# Patient Record
Sex: Female | Born: 1975 | Race: Black or African American | Hispanic: No | Smoking: Never smoker
Health system: Southern US, Community
[De-identification: ages and names within clinical notes are randomized; demographics above are authoritative.]

## PROBLEM LIST (undated history)

## (undated) DIAGNOSIS — M549 Dorsalgia, unspecified: Secondary | ICD-10-CM

## (undated) DIAGNOSIS — F329 Major depressive disorder, single episode, unspecified: Secondary | ICD-10-CM

## (undated) DIAGNOSIS — I10 Essential (primary) hypertension: Secondary | ICD-10-CM

## (undated) DIAGNOSIS — E785 Hyperlipidemia, unspecified: Secondary | ICD-10-CM

## (undated) DIAGNOSIS — F32A Depression, unspecified: Secondary | ICD-10-CM

## (undated) DIAGNOSIS — K5900 Constipation, unspecified: Secondary | ICD-10-CM

## (undated) DIAGNOSIS — K829 Disease of gallbladder, unspecified: Secondary | ICD-10-CM

## (undated) DIAGNOSIS — E559 Vitamin D deficiency, unspecified: Secondary | ICD-10-CM

## (undated) DIAGNOSIS — K219 Gastro-esophageal reflux disease without esophagitis: Secondary | ICD-10-CM

## (undated) DIAGNOSIS — R609 Edema, unspecified: Secondary | ICD-10-CM

## (undated) DIAGNOSIS — M797 Fibromyalgia: Secondary | ICD-10-CM

## (undated) DIAGNOSIS — M2550 Pain in unspecified joint: Secondary | ICD-10-CM

## (undated) DIAGNOSIS — G35 Multiple sclerosis: Secondary | ICD-10-CM

## (undated) DIAGNOSIS — M8580 Other specified disorders of bone density and structure, unspecified site: Secondary | ICD-10-CM

## (undated) HISTORY — DX: Vitamin D deficiency, unspecified: E55.9

## (undated) HISTORY — DX: Edema, unspecified: R60.9

## (undated) HISTORY — PX: ABDOMINAL HYSTERECTOMY: SHX81

## (undated) HISTORY — DX: Constipation, unspecified: K59.00

## (undated) HISTORY — DX: Depression, unspecified: F32.A

## (undated) HISTORY — DX: Essential (primary) hypertension: I10

## (undated) HISTORY — DX: Hyperlipidemia, unspecified: E78.5

## (undated) HISTORY — PX: BREAST REDUCTION SURGERY: SHX8

## (undated) HISTORY — DX: Fibromyalgia: M79.7

## (undated) HISTORY — DX: Pain in unspecified joint: M25.50

## (undated) HISTORY — DX: Gastro-esophageal reflux disease without esophagitis: K21.9

## (undated) HISTORY — PX: TMJ ARTHROPLASTY: SHX1066

## (undated) HISTORY — DX: Multiple sclerosis: G35

## (undated) HISTORY — PX: CHOLECYSTECTOMY: SHX55

## (undated) HISTORY — DX: Other specified disorders of bone density and structure, unspecified site: M85.80

## (undated) HISTORY — DX: Dorsalgia, unspecified: M54.9

## (undated) HISTORY — DX: Disease of gallbladder, unspecified: K82.9

## (undated) HISTORY — PX: ABDOMINOPLASTY: SUR9

## (undated) HISTORY — DX: Major depressive disorder, single episode, unspecified: F32.9

---

## 2016-06-07 DIAGNOSIS — M5126 Other intervertebral disc displacement, lumbar region: Secondary | ICD-10-CM

## 2016-06-07 HISTORY — DX: Other intervertebral disc displacement, lumbar region: M51.26

## 2016-07-10 DIAGNOSIS — M94262 Chondromalacia, left knee: Secondary | ICD-10-CM

## 2016-07-10 HISTORY — DX: Chondromalacia, left knee: M94.262

## 2016-08-01 DIAGNOSIS — M542 Cervicalgia: Secondary | ICD-10-CM | POA: Diagnosis not present

## 2016-08-04 DIAGNOSIS — M25562 Pain in left knee: Secondary | ICD-10-CM | POA: Diagnosis not present

## 2016-08-09 DIAGNOSIS — M25562 Pain in left knee: Secondary | ICD-10-CM | POA: Diagnosis not present

## 2016-08-10 DIAGNOSIS — M542 Cervicalgia: Secondary | ICD-10-CM | POA: Diagnosis not present

## 2016-08-15 DIAGNOSIS — M25562 Pain in left knee: Secondary | ICD-10-CM | POA: Diagnosis not present

## 2016-08-17 DIAGNOSIS — M25562 Pain in left knee: Secondary | ICD-10-CM | POA: Diagnosis not present

## 2016-08-21 DIAGNOSIS — H9311 Tinnitus, right ear: Secondary | ICD-10-CM | POA: Diagnosis not present

## 2016-08-21 DIAGNOSIS — M94262 Chondromalacia, left knee: Secondary | ICD-10-CM | POA: Diagnosis not present

## 2016-08-21 DIAGNOSIS — H6983 Other specified disorders of Eustachian tube, bilateral: Secondary | ICD-10-CM | POA: Diagnosis not present

## 2016-08-24 DIAGNOSIS — M25562 Pain in left knee: Secondary | ICD-10-CM | POA: Diagnosis not present

## 2016-08-25 DIAGNOSIS — H9311 Tinnitus, right ear: Secondary | ICD-10-CM | POA: Diagnosis not present

## 2016-08-25 DIAGNOSIS — R9082 White matter disease, unspecified: Secondary | ICD-10-CM | POA: Diagnosis not present

## 2016-08-29 DIAGNOSIS — G35 Multiple sclerosis: Secondary | ICD-10-CM | POA: Diagnosis not present

## 2016-08-30 DIAGNOSIS — M25562 Pain in left knee: Secondary | ICD-10-CM | POA: Diagnosis not present

## 2016-08-31 DIAGNOSIS — H9311 Tinnitus, right ear: Secondary | ICD-10-CM | POA: Diagnosis not present

## 2016-09-06 DIAGNOSIS — M25562 Pain in left knee: Secondary | ICD-10-CM | POA: Diagnosis not present

## 2016-09-06 DIAGNOSIS — M542 Cervicalgia: Secondary | ICD-10-CM | POA: Diagnosis not present

## 2016-09-11 DIAGNOSIS — R300 Dysuria: Secondary | ICD-10-CM | POA: Diagnosis not present

## 2016-09-11 DIAGNOSIS — Z6833 Body mass index (BMI) 33.0-33.9, adult: Secondary | ICD-10-CM | POA: Diagnosis not present

## 2016-09-11 DIAGNOSIS — N390 Urinary tract infection, site not specified: Secondary | ICD-10-CM | POA: Diagnosis not present

## 2016-09-20 DIAGNOSIS — M25562 Pain in left knee: Secondary | ICD-10-CM | POA: Diagnosis not present

## 2016-11-20 DIAGNOSIS — H9203 Otalgia, bilateral: Secondary | ICD-10-CM | POA: Diagnosis not present

## 2016-11-20 DIAGNOSIS — H93A1 Pulsatile tinnitus, right ear: Secondary | ICD-10-CM | POA: Diagnosis not present

## 2016-11-21 DIAGNOSIS — Z5181 Encounter for therapeutic drug level monitoring: Secondary | ICD-10-CM | POA: Diagnosis not present

## 2016-11-21 DIAGNOSIS — Z79899 Other long term (current) drug therapy: Secondary | ICD-10-CM | POA: Diagnosis not present

## 2016-11-21 DIAGNOSIS — F988 Other specified behavioral and emotional disorders with onset usually occurring in childhood and adolescence: Secondary | ICD-10-CM | POA: Diagnosis not present

## 2016-11-27 DIAGNOSIS — M546 Pain in thoracic spine: Secondary | ICD-10-CM | POA: Diagnosis not present

## 2016-11-27 DIAGNOSIS — Z79899 Other long term (current) drug therapy: Secondary | ICD-10-CM | POA: Diagnosis not present

## 2016-11-27 DIAGNOSIS — Z130 Encounter for screening for diseases of the blood and blood-forming organs and certain disorders involving the immune mechanism: Secondary | ICD-10-CM | POA: Diagnosis not present

## 2016-11-27 DIAGNOSIS — M542 Cervicalgia: Secondary | ICD-10-CM | POA: Diagnosis not present

## 2016-11-27 DIAGNOSIS — R748 Abnormal levels of other serum enzymes: Secondary | ICD-10-CM | POA: Diagnosis not present

## 2016-11-27 DIAGNOSIS — M47817 Spondylosis without myelopathy or radiculopathy, lumbosacral region: Secondary | ICD-10-CM | POA: Diagnosis not present

## 2016-11-27 DIAGNOSIS — R5383 Other fatigue: Secondary | ICD-10-CM | POA: Diagnosis not present

## 2016-11-27 DIAGNOSIS — D509 Iron deficiency anemia, unspecified: Secondary | ICD-10-CM | POA: Diagnosis not present

## 2016-11-27 DIAGNOSIS — E538 Deficiency of other specified B group vitamins: Secondary | ICD-10-CM | POA: Diagnosis not present

## 2016-11-28 DIAGNOSIS — H93A1 Pulsatile tinnitus, right ear: Secondary | ICD-10-CM | POA: Diagnosis not present

## 2016-12-19 DIAGNOSIS — Q165 Congenital malformation of inner ear: Secondary | ICD-10-CM | POA: Diagnosis not present

## 2016-12-19 DIAGNOSIS — H9311 Tinnitus, right ear: Secondary | ICD-10-CM | POA: Diagnosis not present

## 2017-01-03 DIAGNOSIS — G35 Multiple sclerosis: Secondary | ICD-10-CM | POA: Diagnosis not present

## 2017-01-04 DIAGNOSIS — N390 Urinary tract infection, site not specified: Secondary | ICD-10-CM | POA: Diagnosis not present

## 2017-01-19 DIAGNOSIS — M8580 Other specified disorders of bone density and structure, unspecified site: Secondary | ICD-10-CM | POA: Diagnosis not present

## 2017-01-19 DIAGNOSIS — Z8669 Personal history of other diseases of the nervous system and sense organs: Secondary | ICD-10-CM | POA: Diagnosis not present

## 2017-02-03 DIAGNOSIS — Z6830 Body mass index (BMI) 30.0-30.9, adult: Secondary | ICD-10-CM | POA: Diagnosis not present

## 2017-02-03 DIAGNOSIS — Z8619 Personal history of other infectious and parasitic diseases: Secondary | ICD-10-CM | POA: Diagnosis not present

## 2017-02-12 DIAGNOSIS — N898 Other specified noninflammatory disorders of vagina: Secondary | ICD-10-CM | POA: Diagnosis not present

## 2017-02-12 DIAGNOSIS — Z113 Encounter for screening for infections with a predominantly sexual mode of transmission: Secondary | ICD-10-CM | POA: Diagnosis not present

## 2017-02-12 DIAGNOSIS — N760 Acute vaginitis: Secondary | ICD-10-CM | POA: Diagnosis not present

## 2017-03-05 DIAGNOSIS — H9311 Tinnitus, right ear: Secondary | ICD-10-CM | POA: Diagnosis not present

## 2017-03-05 DIAGNOSIS — H9202 Otalgia, left ear: Secondary | ICD-10-CM | POA: Diagnosis not present

## 2017-03-05 DIAGNOSIS — H9190 Unspecified hearing loss, unspecified ear: Secondary | ICD-10-CM | POA: Diagnosis not present

## 2017-04-09 DIAGNOSIS — R102 Pelvic and perineal pain: Secondary | ICD-10-CM | POA: Diagnosis not present

## 2017-04-09 DIAGNOSIS — N390 Urinary tract infection, site not specified: Secondary | ICD-10-CM | POA: Diagnosis not present

## 2017-04-09 DIAGNOSIS — N83209 Unspecified ovarian cyst, unspecified side: Secondary | ICD-10-CM | POA: Diagnosis not present

## 2017-04-09 DIAGNOSIS — R300 Dysuria: Secondary | ICD-10-CM | POA: Diagnosis not present

## 2017-04-17 DIAGNOSIS — R102 Pelvic and perineal pain: Secondary | ICD-10-CM | POA: Diagnosis not present

## 2017-04-26 DIAGNOSIS — N9419 Other specified dyspareunia: Secondary | ICD-10-CM | POA: Diagnosis not present

## 2017-04-26 DIAGNOSIS — N760 Acute vaginitis: Secondary | ICD-10-CM | POA: Diagnosis not present

## 2017-04-26 DIAGNOSIS — R102 Pelvic and perineal pain: Secondary | ICD-10-CM | POA: Diagnosis not present

## 2017-07-27 DIAGNOSIS — Z136 Encounter for screening for cardiovascular disorders: Secondary | ICD-10-CM | POA: Diagnosis not present

## 2017-07-27 DIAGNOSIS — Z13220 Encounter for screening for lipoid disorders: Secondary | ICD-10-CM | POA: Diagnosis not present

## 2017-07-27 DIAGNOSIS — Z0000 Encounter for general adult medical examination without abnormal findings: Secondary | ICD-10-CM | POA: Diagnosis not present

## 2017-07-27 DIAGNOSIS — Z130 Encounter for screening for diseases of the blood and blood-forming organs and certain disorders involving the immune mechanism: Secondary | ICD-10-CM | POA: Diagnosis not present

## 2017-07-27 DIAGNOSIS — Z79899 Other long term (current) drug therapy: Secondary | ICD-10-CM | POA: Diagnosis not present

## 2017-07-27 DIAGNOSIS — N959 Unspecified menopausal and perimenopausal disorder: Secondary | ICD-10-CM | POA: Diagnosis not present

## 2017-07-27 DIAGNOSIS — Z1321 Encounter for screening for nutritional disorder: Secondary | ICD-10-CM | POA: Diagnosis not present

## 2017-08-02 DIAGNOSIS — Z5181 Encounter for therapeutic drug level monitoring: Secondary | ICD-10-CM | POA: Diagnosis not present

## 2017-08-02 DIAGNOSIS — F988 Other specified behavioral and emotional disorders with onset usually occurring in childhood and adolescence: Secondary | ICD-10-CM | POA: Diagnosis not present

## 2017-08-02 DIAGNOSIS — G4709 Other insomnia: Secondary | ICD-10-CM | POA: Diagnosis not present

## 2017-08-20 DIAGNOSIS — M2550 Pain in unspecified joint: Secondary | ICD-10-CM | POA: Diagnosis not present

## 2017-08-20 DIAGNOSIS — Z90710 Acquired absence of both cervix and uterus: Secondary | ICD-10-CM | POA: Diagnosis not present

## 2017-08-20 DIAGNOSIS — F329 Major depressive disorder, single episode, unspecified: Secondary | ICD-10-CM | POA: Diagnosis not present

## 2017-08-20 DIAGNOSIS — J029 Acute pharyngitis, unspecified: Secondary | ICD-10-CM | POA: Diagnosis not present

## 2017-08-20 DIAGNOSIS — Z9049 Acquired absence of other specified parts of digestive tract: Secondary | ICD-10-CM | POA: Diagnosis not present

## 2017-08-20 DIAGNOSIS — K219 Gastro-esophageal reflux disease without esophagitis: Secondary | ICD-10-CM | POA: Diagnosis not present

## 2017-08-20 DIAGNOSIS — M810 Age-related osteoporosis without current pathological fracture: Secondary | ICD-10-CM | POA: Diagnosis not present

## 2017-08-20 DIAGNOSIS — G35 Multiple sclerosis: Secondary | ICD-10-CM | POA: Diagnosis not present

## 2017-08-20 DIAGNOSIS — J069 Acute upper respiratory infection, unspecified: Secondary | ICD-10-CM | POA: Diagnosis not present

## 2017-08-24 DIAGNOSIS — R0602 Shortness of breath: Secondary | ICD-10-CM | POA: Insufficient documentation

## 2017-08-24 DIAGNOSIS — J069 Acute upper respiratory infection, unspecified: Secondary | ICD-10-CM | POA: Insufficient documentation

## 2017-08-24 HISTORY — DX: Acute upper respiratory infection, unspecified: J06.9

## 2017-08-24 HISTORY — DX: Shortness of breath: R06.02

## 2017-09-04 DIAGNOSIS — Z1231 Encounter for screening mammogram for malignant neoplasm of breast: Secondary | ICD-10-CM | POA: Diagnosis not present

## 2017-09-10 DIAGNOSIS — M545 Low back pain: Secondary | ICD-10-CM | POA: Diagnosis not present

## 2017-09-10 DIAGNOSIS — R5383 Other fatigue: Secondary | ICD-10-CM | POA: Diagnosis not present

## 2017-09-10 DIAGNOSIS — M2550 Pain in unspecified joint: Secondary | ICD-10-CM | POA: Diagnosis not present

## 2017-09-10 DIAGNOSIS — G35 Multiple sclerosis: Secondary | ICD-10-CM | POA: Diagnosis not present

## 2017-09-10 DIAGNOSIS — E559 Vitamin D deficiency, unspecified: Secondary | ICD-10-CM | POA: Diagnosis not present

## 2017-10-01 DIAGNOSIS — G35 Multiple sclerosis: Secondary | ICD-10-CM | POA: Diagnosis not present

## 2017-10-01 DIAGNOSIS — R102 Pelvic and perineal pain: Secondary | ICD-10-CM | POA: Diagnosis not present

## 2017-10-01 DIAGNOSIS — K219 Gastro-esophageal reflux disease without esophagitis: Secondary | ICD-10-CM | POA: Diagnosis not present

## 2017-10-01 DIAGNOSIS — N760 Acute vaginitis: Secondary | ICD-10-CM | POA: Insufficient documentation

## 2017-10-01 DIAGNOSIS — Z114 Encounter for screening for human immunodeficiency virus [HIV]: Secondary | ICD-10-CM | POA: Diagnosis not present

## 2017-10-01 DIAGNOSIS — G8929 Other chronic pain: Secondary | ICD-10-CM | POA: Diagnosis not present

## 2017-10-01 DIAGNOSIS — Z885 Allergy status to narcotic agent status: Secondary | ICD-10-CM | POA: Diagnosis not present

## 2017-10-01 DIAGNOSIS — Z9049 Acquired absence of other specified parts of digestive tract: Secondary | ICD-10-CM | POA: Diagnosis not present

## 2017-10-01 DIAGNOSIS — M810 Age-related osteoporosis without current pathological fracture: Secondary | ICD-10-CM | POA: Diagnosis not present

## 2017-10-01 DIAGNOSIS — Z113 Encounter for screening for infections with a predominantly sexual mode of transmission: Secondary | ICD-10-CM | POA: Insufficient documentation

## 2017-10-01 HISTORY — DX: Encounter for screening for infections with a predominantly sexual mode of transmission: Z11.3

## 2017-10-01 HISTORY — DX: Acute vaginitis: N76.0

## 2017-10-09 DIAGNOSIS — M7061 Trochanteric bursitis, right hip: Secondary | ICD-10-CM | POA: Diagnosis not present

## 2017-10-09 DIAGNOSIS — M2550 Pain in unspecified joint: Secondary | ICD-10-CM | POA: Diagnosis not present

## 2017-10-09 DIAGNOSIS — M8588 Other specified disorders of bone density and structure, other site: Secondary | ICD-10-CM | POA: Diagnosis not present

## 2017-10-09 DIAGNOSIS — G35 Multiple sclerosis: Secondary | ICD-10-CM | POA: Diagnosis not present

## 2017-10-19 DIAGNOSIS — N3000 Acute cystitis without hematuria: Secondary | ICD-10-CM | POA: Diagnosis not present

## 2017-10-19 DIAGNOSIS — R350 Frequency of micturition: Secondary | ICD-10-CM | POA: Diagnosis not present

## 2017-10-19 DIAGNOSIS — R300 Dysuria: Secondary | ICD-10-CM | POA: Diagnosis not present

## 2017-10-24 DIAGNOSIS — M2550 Pain in unspecified joint: Secondary | ICD-10-CM | POA: Diagnosis not present

## 2017-10-24 DIAGNOSIS — M8588 Other specified disorders of bone density and structure, other site: Secondary | ICD-10-CM | POA: Diagnosis not present

## 2017-10-24 DIAGNOSIS — E559 Vitamin D deficiency, unspecified: Secondary | ICD-10-CM | POA: Diagnosis not present

## 2017-10-24 DIAGNOSIS — G35 Multiple sclerosis: Secondary | ICD-10-CM | POA: Diagnosis not present

## 2017-11-19 DIAGNOSIS — N9419 Other specified dyspareunia: Secondary | ICD-10-CM | POA: Diagnosis not present

## 2017-11-19 DIAGNOSIS — R102 Pelvic and perineal pain: Secondary | ICD-10-CM | POA: Diagnosis not present

## 2017-12-13 DIAGNOSIS — R300 Dysuria: Secondary | ICD-10-CM | POA: Diagnosis not present

## 2017-12-13 DIAGNOSIS — M797 Fibromyalgia: Secondary | ICD-10-CM | POA: Diagnosis not present

## 2017-12-13 DIAGNOSIS — G35 Multiple sclerosis: Secondary | ICD-10-CM | POA: Diagnosis not present

## 2018-01-29 DIAGNOSIS — Z1159 Encounter for screening for other viral diseases: Secondary | ICD-10-CM | POA: Diagnosis not present

## 2018-01-29 DIAGNOSIS — R350 Frequency of micturition: Secondary | ICD-10-CM | POA: Diagnosis not present

## 2018-01-29 DIAGNOSIS — R1011 Right upper quadrant pain: Secondary | ICD-10-CM | POA: Diagnosis not present

## 2018-01-29 DIAGNOSIS — R0602 Shortness of breath: Secondary | ICD-10-CM | POA: Diagnosis not present

## 2018-01-29 DIAGNOSIS — K219 Gastro-esophageal reflux disease without esophagitis: Secondary | ICD-10-CM | POA: Diagnosis not present

## 2018-01-29 DIAGNOSIS — G35 Multiple sclerosis: Secondary | ICD-10-CM | POA: Diagnosis not present

## 2018-01-29 DIAGNOSIS — Z113 Encounter for screening for infections with a predominantly sexual mode of transmission: Secondary | ICD-10-CM | POA: Diagnosis not present

## 2018-01-29 DIAGNOSIS — Z885 Allergy status to narcotic agent status: Secondary | ICD-10-CM | POA: Diagnosis not present

## 2018-01-29 DIAGNOSIS — M810 Age-related osteoporosis without current pathological fracture: Secondary | ICD-10-CM | POA: Diagnosis not present

## 2018-01-29 DIAGNOSIS — G8929 Other chronic pain: Secondary | ICD-10-CM | POA: Diagnosis not present

## 2018-01-29 DIAGNOSIS — M549 Dorsalgia, unspecified: Secondary | ICD-10-CM | POA: Diagnosis not present

## 2018-01-29 DIAGNOSIS — N898 Other specified noninflammatory disorders of vagina: Secondary | ICD-10-CM | POA: Diagnosis not present

## 2018-01-29 DIAGNOSIS — Z7289 Other problems related to lifestyle: Secondary | ICD-10-CM | POA: Diagnosis not present

## 2018-01-29 DIAGNOSIS — Z79899 Other long term (current) drug therapy: Secondary | ICD-10-CM | POA: Diagnosis not present

## 2018-02-03 DIAGNOSIS — R350 Frequency of micturition: Secondary | ICD-10-CM

## 2018-02-03 DIAGNOSIS — N898 Other specified noninflammatory disorders of vagina: Secondary | ICD-10-CM | POA: Insufficient documentation

## 2018-02-03 DIAGNOSIS — R1011 Right upper quadrant pain: Secondary | ICD-10-CM

## 2018-02-03 DIAGNOSIS — R609 Edema, unspecified: Secondary | ICD-10-CM | POA: Insufficient documentation

## 2018-02-03 DIAGNOSIS — Z7289 Other problems related to lifestyle: Secondary | ICD-10-CM

## 2018-02-03 HISTORY — DX: Other problems related to lifestyle: Z72.89

## 2018-02-03 HISTORY — DX: Other specified noninflammatory disorders of vagina: N89.8

## 2018-02-03 HISTORY — DX: Frequency of micturition: R35.0

## 2018-02-03 HISTORY — DX: Right upper quadrant pain: R10.11

## 2018-02-05 DIAGNOSIS — Z9049 Acquired absence of other specified parts of digestive tract: Secondary | ICD-10-CM | POA: Diagnosis not present

## 2018-02-05 DIAGNOSIS — R1011 Right upper quadrant pain: Secondary | ICD-10-CM | POA: Diagnosis not present

## 2018-02-06 DIAGNOSIS — M2550 Pain in unspecified joint: Secondary | ICD-10-CM | POA: Diagnosis not present

## 2018-02-06 DIAGNOSIS — R51 Headache: Secondary | ICD-10-CM | POA: Diagnosis not present

## 2018-02-06 DIAGNOSIS — R5383 Other fatigue: Secondary | ICD-10-CM | POA: Diagnosis not present

## 2018-02-06 DIAGNOSIS — G35 Multiple sclerosis: Secondary | ICD-10-CM | POA: Diagnosis not present

## 2018-02-06 DIAGNOSIS — M797 Fibromyalgia: Secondary | ICD-10-CM | POA: Diagnosis not present

## 2018-03-21 DIAGNOSIS — G8929 Other chronic pain: Secondary | ICD-10-CM | POA: Diagnosis not present

## 2018-03-21 DIAGNOSIS — R1011 Right upper quadrant pain: Secondary | ICD-10-CM | POA: Diagnosis not present

## 2018-03-21 DIAGNOSIS — G35 Multiple sclerosis: Secondary | ICD-10-CM | POA: Diagnosis not present

## 2018-03-25 DIAGNOSIS — H20012 Primary iridocyclitis, left eye: Secondary | ICD-10-CM | POA: Diagnosis not present

## 2018-03-28 DIAGNOSIS — H20012 Primary iridocyclitis, left eye: Secondary | ICD-10-CM | POA: Diagnosis not present

## 2018-04-02 ENCOUNTER — Telehealth: Payer: Self-pay | Admitting: Neurology

## 2018-04-02 ENCOUNTER — Other Ambulatory Visit: Payer: Self-pay

## 2018-04-02 ENCOUNTER — Encounter: Payer: Self-pay | Admitting: Neurology

## 2018-04-02 ENCOUNTER — Ambulatory Visit (INDEPENDENT_AMBULATORY_CARE_PROVIDER_SITE_OTHER): Payer: Medicare Other | Admitting: Neurology

## 2018-04-02 VITALS — BP 126/92 | HR 80 | Resp 18 | Ht 66.0 in | Wt 198.0 lb

## 2018-04-02 DIAGNOSIS — R200 Anesthesia of skin: Secondary | ICD-10-CM

## 2018-04-02 DIAGNOSIS — R7989 Other specified abnormal findings of blood chemistry: Secondary | ICD-10-CM

## 2018-04-02 DIAGNOSIS — R9082 White matter disease, unspecified: Secondary | ICD-10-CM | POA: Diagnosis not present

## 2018-04-02 DIAGNOSIS — G4700 Insomnia, unspecified: Secondary | ICD-10-CM | POA: Insufficient documentation

## 2018-04-02 DIAGNOSIS — R109 Unspecified abdominal pain: Secondary | ICD-10-CM | POA: Diagnosis not present

## 2018-04-02 HISTORY — DX: Insomnia, unspecified: G47.00

## 2018-04-02 HISTORY — DX: Other specified abnormal findings of blood chemistry: R79.89

## 2018-04-02 HISTORY — DX: Anesthesia of skin: R20.0

## 2018-04-02 HISTORY — DX: White matter disease, unspecified: R90.82

## 2018-04-02 NOTE — Telephone Encounter (Signed)
Medicare/medicaid order sent to [facility]. No auth they will reach out to the pt to schedule.  °

## 2018-04-02 NOTE — Progress Notes (Signed)
[facility]  PATIENT: Erica Henson DOB: 1975-10-25  REFERRING DOCTOR OR PCP:  Maud Deed, PA-C/ Dr. Delbert Harness SOURCE: Patient, notes from neurologist in [place] in [address], notes from primary care, multiple MRI and laboratory reports.  _________________________________   HISTORICAL  CHIEF COMPLAINT:  Chief Complaint  Patient presents with  . Multiple Sclerosis    Armando is here to transfer care of MS to Dr. Epimenio Foot.  Sts. she was dx. with MS in 2008.  Sts. presenting sx. was intermittent numbness random parts of her body. Sts. she had an MRI and LP but can't remember the results. Sts. she has seen neurologists in [place], [place] (Dr. Dutch Quint), and [place], [place] (Dr. Lelon Perla).  Sts. she has been on Betaseron, Avonex, Copaxone, but wsa told she had to stop injections b/c she had antibodies to them that would cause kidney damange.  Sts. she also tried Gilenya but   . Thoracic Pain    stopped b/c she didn't feel it was helping, and Tecfidera but stopped due to stomach upset.  Has not been on a dmt in the last 3 yrs.  Sts. she chooses to treat the sx. of MS with natural remedies.   Denies family hx. of MS. Sts. last MRI was in [place] at [facility] in 2018. Today would like to discuss right sided intermittent thoracic pain which she believes to be an MS hug./fim    HISTORY OF PRESENT ILLNESS:  I had the pleasure seeing a patient, Erica Henson, at the MS center in [facility] for neurologic consultation regarding her diagnosis of MS and dysesthesias.  She is a 42 y.o. woman who was diagnosed with MS in 2008.   She was having headaches and various pains and numbness.     She also noted issues with cognition.   She had an MRI of the brain that was reportedly consistent with MS.  Initial MRI reports from 2008 of the cervical spine states possible C5C6 focus to the right only seen on one slice of one sequence.   She had a lumbar  puncture 9/16/21008.   CSF did not show OCB or elevated IgG index.   She reports being told there was a lesion in her brainstem (I don't see on any report)  She was diagnosed with MS by Dr. Noelle Penner in [place], [place] and placed on Betaseron and then switched to Avonex.   She had antibodies against interferons.   She was started on Copaxone but did not feel it helped.    She also tried on Gilenya and Tecfidera but either felt there was no benefit (Gilenya) or GI side effects.    The last 3 years, she has tried natural remedies and vitamins.   She reports multiple rounds of steroids (30 times) over the first 10 years.     A report from 08/22/2017 states "Patches of T2 hyperintensity are identified with a single new lesion in the right parieto-occipal WM.   These are non-specifc and may be related to demyelination form MS, other demyelinating syndrome, infectious or other less likely etiology"  Repeat cervical spine imaging 08/23/2007 showed no lesions in the cervical spine or upper thoracic spinal cord.     An MRI report from 08/06/2011 [facility]) states that there are multiple white matter foci unchanged since the previous study (08/08/2010).  "There are greater than 9 lesions, at least 3 of which are in the periventricular white matter oriented perpendicular to the lateral ventricles.  There are  no subcortical lesions.  There are no infratentorial lesions.  No enhancing lesions are seen".   The last MRI of the cervical spine performed at [facility] 01/01/2010 states (normal MRI of the cervical spine).  The last MRI of the thoracic spine dated 08/11/2008 at [facility] has an impression "no evidence of demyelinating disease".  There is no significant degenerative change noted.  She thinks last MRI was 2017 or 2018 but is not sure which body part [facility] [place]).      She reports difficulty with her gait and balance and sometimes trips easily.    She feels strength is ok but she fatigues easily and can't run.   She reports  numbness that comes and goes in her toes and sometimes notes her toes change colors.   She gets numbness in her arms.   Three years ago, she noted tongue numbness.     She reports a painful right dysesthetic pain in her flank with a burning quality.  Initially it was constant x several days and then became intermittent.       She denies urinary frequency and urge incontinence in the past and had a sling procedure in 2004.   She has 4 children.    Since the operation, her bladder has done well.  She notes floaters in her vision and has intermittent blurry vision.   She injured her left eye (charger plyug hit cornea) last week but is better now.  She notes a lot of fatigue.  She has a long history of insomnia.   She takes Ambien prn.      She notes cognitive fatigue and attentional issues and has prn Adderall (20 mg max).    She denies any current anxiety or depression but had this problem in the past.    She reports pain in her neck and back, worse with activity.   She reports degenerative disc changes in the neck and lumbar spine.   She was once diagnosed with osteopenia and placed on Vit D.      There is no FH of MS.     Her father has AD.          REVIEW OF SYSTEMS: Constitutional: No fevers, chills, sweats, or change in appetite.   She has occasional insomnia. Eyes: No visual changes, double vision, eye pain Ear, nose and throat: No hearing loss, ear pain, nasal congestion, sore throat Cardiovascular: No chest pain, palpitations Respiratory: No shortness of breath at rest or with exertion.   No wheezes GastrointestinaI: No nausea, vomiting, diarrhea, abdominal pain, fecal incontinence Genitourinary: No dysuria, urinary retention or frequency.  No nocturia. Musculoskeletal: No neck pain, back pain Integumentary: No rash, pruritus, skin lesions Neurological: as above Psychiatric: She denies current depression or anxiety but had issues with this in the past. Endocrine: No palpitations,  diaphoresis, change in appetite, change in weigh or increased thirst Hematologic/Lymphatic: No anemia, purpura, petechiae. Allergic/Immunologic: No itchy/runny eyes, nasal congestion, recent allergic reactions, rashes  ALLERGIES: Not on File  HOME MEDICATIONS: No current outpatient medications on file.  PAST MEDICAL HISTORY: History reviewed. No pertinent past medical history.  PAST SURGICAL HISTORY: History reviewed. No pertinent surgical history.  FAMILY HISTORY: History reviewed. No pertinent family history.  SOCIAL HISTORY:  Social History   Socioeconomic History  . Marital status: Divorced    Spouse name: Not on file  . Number of children: Not on file  . Years of education: Not on file  . Highest education level: Not  on file  Occupational History  . Not on file  Social Needs  . Financial resource strain: Not on file  . Food insecurity:    Worry: Not on file    Inability: Not on file  . Transportation needs:    Medical: Not on file    Non-medical: Not on file  Tobacco Use  . Smoking status: Never Smoker  . Smokeless tobacco: Never Used  Substance and Sexual Activity  . Alcohol use: Yes    Comment: social  . Drug use: Never  . Sexual activity: Not on file  Lifestyle  . Physical activity:    Days per week: Not on file    Minutes per session: Not on file  . Stress: Not on file  Relationships  . Social connections:    Talks on phone: Not on file    Gets together: Not on file    Attends religious service: Not on file    Active member of club or organization: Not on file    Attends meetings of clubs or organizations: Not on file    Relationship status: Not on file  . Intimate partner violence:    Fear of current or ex partner: Not on file    Emotionally abused: Not on file    Physically abused: Not on file    Forced sexual activity: Not on file  Other Topics Concern  . Not on file  Social History Narrative  . Not on file     PHYSICAL  EXAM  Vitals:   04/02/18 1336  BP: (!) 126/92  Pulse: 80  Resp: 18  Weight: 198 lb (89.8 kg)  Height: 5\' 6"  (1.676 m)    Body mass index is 31.96 kg/m.   General: The patient is well-developed and well-nourished and in no acute distress  Eyes:  Funduscopic exam shows normal optic discs and retinal vessels.  Neck: The neck is supple, no carotid bruits are noted.  The neck is nontender.  Cardiovascular: The heart has a regular rate and rhythm with a normal S1 and S2. There were no murmurs, gallops or rubs. Lungs are clear to auscultation.  Skin: Extremities are without significant edema.  Musculoskeletal:  Back is nontender  Neurologic Exam  Mental status: The patient is alert and oriented x 3 at the time of the examination. The patient has apparent normal recent and remote memory, with an apparently normal attention span and concentration ability.   Speech is normal.  Cranial nerves: Extraocular movements are full. Pupils are equal, round, and reactive to light and accomodation.  Visual fields are full.  Facial symmetry is present. There is good facial sensation to soft touch bilaterally.Facial strength is normal.  Trapezius and sternocleidomastoid strength is normal. No dysarthria is noted.  The tongue is midline, and the patient has symmetric elevation of the soft palate. No obvious hearing deficits are noted.  Motor:  Muscle bulk is normal.   Tone is normal. Strength is  5 / 5 in all 4 extremities.   Sensory: She reported mildly asymmetric sensation to touch in the arms and legs temporary vibration sensation was symmetric.  Coordination: Cerebellar testing reveals good finger-nose-finger and heel-to-shin bilaterally.  Gait and station: Station is normal.   Gait is normal. Tandem gait is normal. Romberg is negative.   Reflexes: Deep tendon reflexes are symmetric and normal bilaterally.   Plantar responses are flexor.    DIAGNOSTIC DATA (LABS, IMAGING, TESTING) - I  reviewed patient records, labs, notes, testing and  imaging myself where available.      ASSESSMENT AND PLAN  Numbness - Plan: MR BRAIN W WO CONTRAST, MR CERVICAL SPINE W WO CONTRAST  White matter abnormality on MRI of brain - Plan: MR BRAIN W WO CONTRAST, MR CERVICAL SPINE W WO CONTRAST  Insomnia, unspecified type  Low vitamin D level   In summary, Ms. Buckles is a 42 year old woman who was diagnosed with MS in 2008 after presenting with pains and intermittent numbness.   Initial MRI of the brain showed lesions were sent for MS and a possible focus in the spinal cord.  Second MRI noted that the lesions were nonspecific and 2 of the cervical spine MRIs did not show continued evidence of a cervical spine plaque.  CSF did not show oligoclonal bands or elevated IgG index.  I discussed with her that she could have a very mild MS though I cannot be completely certain of the diagnosis.  We need to check another MRI of the brain and cervical spine and I will compare those with her prior scans if I am able to obtain them.  If she does show significant progression worrisome for MS, then the diagnosis is more likely and I would recommend that she go on a disease modifying therapy.  However, if the MRI is stable while she has gone 3 years off of a disease modifying therapy, then MS is less likely.  She would prefer supplements over and approved disease modifying therapy.  I mentioned that alpha lipoic acid may be helpful for some of her symptoms.  She will return in 4 months for regular visit or sooner based on the findings of the MRIs.  She should call if she has new or worsening symptoms.  Thank you for asking me to see Ms. Tasia Catchings.  Please let me know if I can be of further assistance with her or other patients in the future.     Richard A. Epimenio Foot, MD, [facility] 04/02/2018, 1:39 PM Certified in Neurology, Clinical Neurophysiology, Sleep Medicine, Pain Medicine and Neuroimaging  [facility] [address] [place], [place] [address] [phone]

## 2018-04-15 ENCOUNTER — Ambulatory Visit
Admission: RE | Admit: 2018-04-15 | Discharge: 2018-04-15 | Disposition: A | Discharge status: Home / Self Care | Payer: Medicare Other | Source: Ambulatory Visit | Attending: Neurology | Admitting: Neurology

## 2018-04-15 DIAGNOSIS — R200 Anesthesia of skin: Secondary | ICD-10-CM

## 2018-04-15 DIAGNOSIS — R9082 White matter disease, unspecified: Secondary | ICD-10-CM

## 2018-04-15 MED ORDER — GADOBENATE DIMEGLUMINE 529 MG/ML IV SOLN
18.0000 mL | Freq: Once | INTRAVENOUS | Status: AC | PRN
  Administered 2018-04-15: 18 mL via INTRAVENOUS

## 2018-04-16 ENCOUNTER — Telehealth: Payer: Self-pay | Admitting: *Deleted

## 2018-04-16 NOTE — Telephone Encounter (Signed)
-----   Message from Asa Lente, MD sent at 04/15/2018  6:54 PM EDT ----- Please let her know that the MRI of the brain showed some old looking spots but nothing that looked new.  We never got her old films for comparison.    The MRI of the cervical spine did not show any abnormality in the spinal cord.

## 2018-04-16 NOTE — Telephone Encounter (Signed)
LMOM for Ceclia to call for results./fim

## 2018-04-16 NOTE — Telephone Encounter (Signed)
-----   Message from Richard A Sater, MD sent at 04/15/2018  6:54 PM EDT ----- Please let her know that the MRI of the brain showed some old looking spots but nothing that looked new.  We never got her old films for comparison.    The MRI of the cervical spine did not show any abnormality in the spinal cord. 

## 2018-04-16 NOTE — Telephone Encounter (Signed)
Spoke with Renee and reviewed below MRI reports. She verbalized understanding of same.  She dropped old MRI's off last wk when Dr. Epimenio Foot and I were both out of the office.  I was able to locate those and have given them to Dr. Epimenio Foot for review/fim

## 2018-04-19 ENCOUNTER — Telehealth: Payer: Self-pay | Admitting: Neurology

## 2018-04-19 NOTE — Telephone Encounter (Signed)
Please let her know I compared her MRIs from 2009 to the current MRIs, side by side.    The MRIs are unchanged... All the spots seen on current MRIs in the brain were seen on the 2009 MRI and the spine has no lesions.     The stability over so many years and the normal CSFmakes MS less likely so I recommend she continue off of MS medications

## 2018-05-02 DIAGNOSIS — N898 Other specified noninflammatory disorders of vagina: Secondary | ICD-10-CM | POA: Diagnosis not present

## 2018-05-02 DIAGNOSIS — R300 Dysuria: Secondary | ICD-10-CM | POA: Diagnosis not present

## 2018-05-10 DIAGNOSIS — R300 Dysuria: Secondary | ICD-10-CM | POA: Diagnosis not present

## 2018-05-20 ENCOUNTER — Telehealth: Payer: Self-pay | Admitting: Neurology

## 2018-05-20 NOTE — Telephone Encounter (Signed)
Spoke with Dr. Epimenio Foot- he tried reaching pt on 04/19/18 to discuss this.   "Please let her know I compared her MRIs from 2009 to the current MRIs, side by side.    The MRIs are unchanged... All the spots seen on current MRIs in the brain were seen on the 2009 MRI and the spine has no lesions.     The stability over so many years and the normal CSFmakes MS less likely so I recommend she continue off of MS medications"

## 2018-05-20 NOTE — Telephone Encounter (Signed)
Called pt, LVM for her to call.

## 2018-05-20 NOTE — Telephone Encounter (Signed)
Pt is asking for a call because she states she has not head from anyone re: the comparison of her MRI's.  The last note is from 04-19-2018 as to why it was not noted.

## 2018-05-21 NOTE — Telephone Encounter (Signed)
Patient called back. I relayed comparison results below per Dr. Epimenio Foot note. She verbalized understanding.   She wants to know what next steps would be to help manage the upper back pain, joint pain she is having. It is throughout her body. She has "nodule bumps" throughout her body that cause pain. Advised I will speak with Dr. Epimenio Foot and call her back.

## 2018-05-22 MED ORDER — MELOXICAM 7.5 MG PO TABS: 7.5000 mg | ORAL_TABLET | Freq: Every day | ORAL | 5 refills | Status: DC

## 2018-05-22 NOTE — Addendum Note (Signed)
Addended by: Hillis Range on: 05/22/2018 03:04 PM   Modules accepted: Orders

## 2018-05-22 NOTE — Telephone Encounter (Signed)
Late entry-Spoke with Dr. Epimenio Foot yesterday. He states she should f/u on skin nodules with PCP. For pain, can offer meloxicam 7.5 mg 1 tablet daily, #30, 5 refills if she would like to try this

## 2018-05-22 NOTE — Telephone Encounter (Signed)
Called pt. Advised she should f/u with PCP about skin nodules.  She was agreeable to try meloxicam 7.5mg  tab for pain.  Went over [employer] of medication with her. She would like rx sent to [facility] in [place], [place]. Advised her to call back if she has further questions/concerns.

## 2018-05-22 NOTE — Telephone Encounter (Signed)
Tried calling pt, line went silent, unable to leave message.

## 2018-05-23 DIAGNOSIS — G35 Multiple sclerosis: Secondary | ICD-10-CM | POA: Insufficient documentation

## 2018-05-23 DIAGNOSIS — G8929 Other chronic pain: Secondary | ICD-10-CM | POA: Diagnosis not present

## 2018-06-04 DIAGNOSIS — N760 Acute vaginitis: Secondary | ICD-10-CM | POA: Diagnosis not present

## 2018-06-04 DIAGNOSIS — B9689 Other specified bacterial agents as the cause of diseases classified elsewhere: Secondary | ICD-10-CM | POA: Diagnosis not present

## 2018-06-19 DIAGNOSIS — H524 Presbyopia: Secondary | ICD-10-CM | POA: Diagnosis not present

## 2018-06-19 DIAGNOSIS — H04123 Dry eye syndrome of bilateral lacrimal glands: Secondary | ICD-10-CM | POA: Diagnosis not present

## 2018-06-24 DIAGNOSIS — G8929 Other chronic pain: Secondary | ICD-10-CM | POA: Diagnosis not present

## 2018-06-24 DIAGNOSIS — E669 Obesity, unspecified: Secondary | ICD-10-CM | POA: Diagnosis not present

## 2018-06-24 DIAGNOSIS — G35 Multiple sclerosis: Secondary | ICD-10-CM | POA: Diagnosis not present

## 2018-06-24 DIAGNOSIS — F900 Attention-deficit hyperactivity disorder, predominantly inattentive type: Secondary | ICD-10-CM | POA: Diagnosis not present

## 2018-07-11 ENCOUNTER — Telehealth: Payer: Self-pay | Admitting: *Deleted

## 2018-07-11 NOTE — Telephone Encounter (Signed)
Patient stopped by the office.  States she brought some records and MRI CD's from another doctors office for Dr. Epimenio Foot to review and she would like to get those.  Would like a call back asap please.

## 2018-07-17 DIAGNOSIS — G35 Multiple sclerosis: Secondary | ICD-10-CM | POA: Diagnosis not present

## 2018-07-17 DIAGNOSIS — M5030 Other cervical disc degeneration, unspecified cervical region: Secondary | ICD-10-CM | POA: Diagnosis not present

## 2018-07-17 DIAGNOSIS — M2550 Pain in unspecified joint: Secondary | ICD-10-CM | POA: Diagnosis not present

## 2018-07-17 DIAGNOSIS — R9082 White matter disease, unspecified: Secondary | ICD-10-CM | POA: Diagnosis not present

## 2018-07-30 DIAGNOSIS — R350 Frequency of micturition: Secondary | ICD-10-CM | POA: Diagnosis not present

## 2018-07-30 DIAGNOSIS — R05 Cough: Secondary | ICD-10-CM | POA: Diagnosis not present

## 2018-08-07 ENCOUNTER — Ambulatory Visit: Payer: Medicare Other | Admitting: Neurology

## 2018-08-21 DIAGNOSIS — M2550 Pain in unspecified joint: Secondary | ICD-10-CM | POA: Diagnosis not present

## 2018-08-21 DIAGNOSIS — M25572 Pain in left ankle and joints of left foot: Secondary | ICD-10-CM | POA: Diagnosis not present

## 2018-08-21 DIAGNOSIS — H04129 Dry eye syndrome of unspecified lacrimal gland: Secondary | ICD-10-CM | POA: Diagnosis not present

## 2018-08-21 DIAGNOSIS — E559 Vitamin D deficiency, unspecified: Secondary | ICD-10-CM | POA: Diagnosis not present

## 2018-08-21 DIAGNOSIS — M791 Myalgia, unspecified site: Secondary | ICD-10-CM | POA: Diagnosis not present

## 2018-08-26 DIAGNOSIS — M25872 Other specified joint disorders, left ankle and foot: Secondary | ICD-10-CM | POA: Diagnosis not present

## 2018-08-27 DIAGNOSIS — Z0000 Encounter for general adult medical examination without abnormal findings: Secondary | ICD-10-CM | POA: Diagnosis not present

## 2018-08-29 ENCOUNTER — Encounter (INDEPENDENT_AMBULATORY_CARE_PROVIDER_SITE_OTHER): Payer: Medicare Other

## 2018-08-30 DIAGNOSIS — N898 Other specified noninflammatory disorders of vagina: Secondary | ICD-10-CM | POA: Diagnosis not present

## 2018-08-30 DIAGNOSIS — R350 Frequency of micturition: Secondary | ICD-10-CM | POA: Diagnosis not present

## 2018-09-03 ENCOUNTER — Ambulatory Visit (INDEPENDENT_AMBULATORY_CARE_PROVIDER_SITE_OTHER): Payer: Medicare Other | Admitting: Family Medicine

## 2018-09-03 ENCOUNTER — Encounter (INDEPENDENT_AMBULATORY_CARE_PROVIDER_SITE_OTHER): Payer: Self-pay | Admitting: Family Medicine

## 2018-09-03 VITALS — BP 103/71 | HR 65 | Temp 97.4°F | Ht 65.0 in | Wt 200.0 lb

## 2018-09-03 DIAGNOSIS — R0602 Shortness of breath: Secondary | ICD-10-CM | POA: Diagnosis not present

## 2018-09-03 DIAGNOSIS — R5383 Other fatigue: Secondary | ICD-10-CM

## 2018-09-03 DIAGNOSIS — E559 Vitamin D deficiency, unspecified: Secondary | ICD-10-CM | POA: Diagnosis not present

## 2018-09-03 DIAGNOSIS — E669 Obesity, unspecified: Secondary | ICD-10-CM | POA: Diagnosis not present

## 2018-09-03 DIAGNOSIS — Z6833 Body mass index (BMI) 33.0-33.9, adult: Secondary | ICD-10-CM

## 2018-09-03 DIAGNOSIS — Z1331 Encounter for screening for depression: Secondary | ICD-10-CM | POA: Diagnosis not present

## 2018-09-03 DIAGNOSIS — Z0289 Encounter for other administrative examinations: Secondary | ICD-10-CM

## 2018-09-03 DIAGNOSIS — G35 Multiple sclerosis: Secondary | ICD-10-CM | POA: Diagnosis not present

## 2018-09-04 ENCOUNTER — Other Ambulatory Visit (INDEPENDENT_AMBULATORY_CARE_PROVIDER_SITE_OTHER): Payer: Self-pay | Admitting: Family Medicine

## 2018-09-04 ENCOUNTER — Encounter (INDEPENDENT_AMBULATORY_CARE_PROVIDER_SITE_OTHER): Payer: Self-pay | Admitting: Family Medicine

## 2018-09-04 DIAGNOSIS — E785 Hyperlipidemia, unspecified: Secondary | ICD-10-CM | POA: Diagnosis not present

## 2018-09-04 DIAGNOSIS — E8881 Metabolic syndrome: Secondary | ICD-10-CM | POA: Diagnosis not present

## 2018-09-04 DIAGNOSIS — R0602 Shortness of breath: Secondary | ICD-10-CM | POA: Diagnosis not present

## 2018-09-04 DIAGNOSIS — R5383 Other fatigue: Secondary | ICD-10-CM | POA: Diagnosis not present

## 2018-09-04 NOTE — Progress Notes (Signed)
Office: [phone]  /  Fax: [phone]   Dear Maud Deed, PA-C,   Thank you for referring Erica Henson to our clinic. The following note includes my evaluation and treatment recommendations.  HPI:   Chief Complaint: OBESITY    Erica Henson has been referred by Maud Deed, PA-C for consultation regarding her obesity and obesity related comorbidities.    Erica Henson (MR# [id number]) is a 43 y.o. female who presents on 09/03/2018 for obesity evaluation and treatment. Current BMI is Body mass index is 33.28 kg/m.Marland Kitchen Erica Henson has been struggling with her weight for many years and has been unsuccessful in either losing weight, maintaining weight loss, or reaching her healthy weight goal.     Erica Henson attended our information session and states she is currently in the action stage of change and ready to dedicate time achieving and maintaining a healthier weight. Erica Henson is interested in becoming our patient and working on intensive lifestyle modifications including (but not limited to) diet, exercise and weight loss.    Mykiah states her family eats meals together she thinks her family will eat healthier with  her her desired weight loss is 40 lbs she started gaining weight 7 years ago her heaviest weight ever was 200-208 lbs she is a picky eater and doesn't like to eat healthier foods  she has significant food cravings issues  she snacks frequently in the evenings she skips meals frequently she is frequently drinking liquids with calories she frequently makes poor food choices she frequently eats larger portions than normal  she struggles with emotional eating    Fatigue Erica Henson feels her energy is lower than it should be. This has worsened with weight gain and has not worsened recently. Erica Henson admits to daytime somnolence and  admits to waking up still tired. Patient is at risk for obstructive sleep apnea. Patent has a history of symptoms of daytime fatigue and  morning headache. Patient generally gets 5 hours of sleep per night, and states they generally have nightime awakenings. Snoring is present. Apneic episodes are not present. Epworth Sleepiness Score is 12.  Dyspnea on exertion Erica Henson notes increasing shortness of breath with exercising and seems to be worsening over time with weight gain. She notes getting out of breath sooner with activity than she used to. This has not gotten worse recently. EKG-Normal sinus rhythm. Brieann denies orthopnea.  Multiple Sclerosis Erica Henson has a diagnosis of multiple sclerosis. She sees Dr. Hyacinth Meeker, Rheumatologist. Her last flare was in January 2019.  Vitamin D Deficiency Erica Henson has a diagnosis of vitamin D deficiency. She is on OTC Vit D currently. Last Vit D level was of 20.6 on 08/21/2018. She denies nausea, vomiting or muscle weakness.  Depression Screen Erica Henson Food and Mood (modified PHQ-9) score was  Depression screen PHQ 2/9 09/03/2018  Decreased Interest 2  Down, Depressed, Hopeless 1  PHQ - 2 Score 3  Altered sleeping 1  Tired, decreased energy 2  Change in appetite 1  Feeling bad or failure about yourself  1  Trouble concentrating 1  Moving slowly or fidgety/restless 1  Suicidal thoughts 0  PHQ-9 Score 10  Difficult doing work/chores Somewhat difficult    ASSESSMENT AND PLAN:  Other fatigue - Plan: EKG 12-Lead, CBC With Differential, Comprehensive metabolic panel, Hemoglobin A1c, Insulin, random, T3, T4, free, TSH  Shortness of breath on exertion - Plan: Lipid Panel With LDL/HDL Ratio  Multiple sclerosis (HCC)  Vitamin D deficiency  Depression screening  Class 1 obesity with serious comorbidity and body  mass index (BMI) of 33.0 to 33.9 in adult, unspecified obesity type  PLAN:  Fatigue Cleve was informed that her fatigue may be related to obesity, depression or many other causes. Labs will be ordered, and in the meanwhile Sashenka has agreed to work on diet, exercise and  weight loss to help with fatigue. Proper sleep hygiene was discussed including the need for 7-8 hours of quality sleep each night. A sleep study was not ordered based on symptoms and Epworth score.  Dyspnea on exertion River's shortness of breath appears to be obesity related and exercise induced. She has agreed to work on weight loss and gradually increase exercise to treat her exercise induced shortness of breath. If [place] follows our instructions and loses weight without improvement of her shortness of breath, we will plan to refer to pulmonology. We will monitor this condition regularly. Erica Henson agrees to this plan.  Multiple Sclerosis Latiesha will follow up with Rheumatology as needed, and she agrees to follow up with our clinic in 2 weeks.  Vitamin D Deficiency Erica Henson was informed that low vitamin D levels contributes to fatigue and are associated with obesity, breast, and colon cancer. She agrees to continue to take prescription Vit D @50 ,000 IU every week and will follow up for routine testing of vitamin D, at least 2-3 times per year. She was informed of the risk of over-replacement of vitamin D and agrees to not increase her dose unless she discusses this with [place] first. We will repeat Vit D level in 3 months. Erica Henson agrees to follow up with our clinic in 2 weeks.  Depression Screen Erica Henson had a moderately positive depression screening. Depression is commonly associated with obesity and often results in emotional eating behaviors. We will monitor this closely and work on CBT to help improve the non-hunger eating patterns. Referral to Psychology may be required if no improvement is seen as she continues in our clinic.  Obesity Erica Henson is currently in the action stage of change and her goal is to continue with weight loss efforts. I recommend Erica Henson begin the structured treatment plan as follows:  She has agreed to follow the Category 2 plan + 100 calories Marlyse has been  instructed to eventually work up to a goal of 150 minutes of combined cardio and strengthening exercise per week for weight loss and overall health benefits. We discussed the following Behavioral Modification Strategies today: increasing lean protein intake, increasing vegetables, work on meal planning and easy cooking plans, and planning for success   She was informed of the importance of frequent follow up visits to maximize her success with intensive lifestyle modifications for her multiple health conditions. She was informed we would discuss her lab results at her next visit unless there is a critical issue that needs to be addressed sooner. Erica Henson agreed to keep her next visit at the agreed upon time to discuss these results.  ALLERGIES: Allergies  Allergen Reactions  . Codeine Itching  . Lunesta [Eszopiclone] Nausea And Vomiting    MEDICATIONS: Current Outpatient Medications on File Prior to Visit  Medication Sig Dispense Refill  . amphetamine-dextroamphetamine (ADDERALL) 10 MG tablet Take 10 mg by mouth daily with breakfast.    . b complex vitamins tablet Take 1 tablet by mouth daily.    Marland Kitchen omeprazole (PRILOSEC) 20 MG capsule Take 20 mg by mouth daily.    . Probiotic Product (PROBIOTIC-10 PO) Take 1 tablet by mouth daily.    . vitamin C (ASCORBIC ACID) 250 MG tablet  Take 250 mg by mouth daily.    . Vitamin D, Ergocalciferol, (DRISDOL) 1.25 MG (50000 UT) CAPS capsule Take 50,000 Units by mouth every 7 (seven) days.     No current facility-administered medications on file prior to visit.     PAST MEDICAL HISTORY: Past Medical History:  Diagnosis Date  . Back pain   . Constipation   . Depression   . Fibromyalgia   . Gallbladder problem   . GERD (gastroesophageal reflux disease)   . Hyperlipidemia   . Hypertension   . Joint pain   . Multiple sclerosis (HCC)   . Osteopenia   . Swelling   . Vitamin D deficiency     PAST SURGICAL HISTORY: Past Surgical History:    Procedure Laterality Date  . ABDOMINAL HYSTERECTOMY    . ABDOMINOPLASTY    . BREAST REDUCTION SURGERY    . CHOLECYSTECTOMY    . TMJ ARTHROPLASTY      SOCIAL HISTORY: Social History   Tobacco Use  . Smoking status: Never Smoker  . Smokeless tobacco: Never Used  Substance Use Topics  . Alcohol use: Yes    Comment: social  . Drug use: Never    FAMILY HISTORY: Family History  Problem Relation Age of Onset  . Diabetes Mother   . Hypertension Mother   . Stroke Father   . Hypertension Father     ROS: Review of Systems  Constitutional: Positive for malaise/fatigue. Negative for weight loss.       + Trouble sleeping  HENT: Positive for tinnitus.        + Hoarseness  Eyes:       + Wear glasses or contacts + Floaters  Respiratory: Positive for shortness of breath (with exertion).   Cardiovascular: Negative for orthopnea.       + Calf/leg pain with walking + Very cold feet or hands  Gastrointestinal: Positive for heartburn. Negative for nausea and vomiting.  Musculoskeletal: Positive for back pain and neck pain.       Negative muscle weakness + Neck stiffness + Muscle or joint pain + Muscle stiffness + red or swollen joints  Skin:       + Dryness  Neurological: Positive for dizziness, tremors, weakness and headaches.  Endo/Heme/Allergies: Bruises/bleeds easily.       + Heat/cold intolerance  Psychiatric/Behavioral:       + Stress    PHYSICAL EXAM: Blood pressure 103/71, pulse 65, temperature (!) 97.4 F (36.3 C), temperature source Oral, height 5\' 5"  (1.651 m), weight 200 lb (90.7 kg), SpO2 100 %. Body mass index is 33.28 kg/m. Physical Exam Vitals signs reviewed.  Constitutional:      Appearance: Normal appearance.  HENT:     Head: Normocephalic and atraumatic.     Nose: Nose normal.  Eyes:     General: No scleral icterus.    Extraocular Movements: Extraocular movements intact.  Neck:     Musculoskeletal: Normal range of motion and neck supple.      Comments: No thyromegaly present Cardiovascular:     Rate and Rhythm: Normal rate and regular rhythm.     Pulses: Normal pulses.     Heart sounds: Normal heart sounds.  Pulmonary:     Effort: Pulmonary effort is normal. No respiratory distress.     Breath sounds: Normal breath sounds.  Abdominal:     Palpations: Abdomen is soft.     Tenderness: There is no abdominal tenderness.     Comments: + Obesity  Musculoskeletal:  Normal range of motion.     Right lower leg: No edema.     Left lower leg: No edema.  Skin:    General: Skin is warm and dry.  Neurological:     Mental Status: She is alert and oriented to person, place, and time.     Coordination: Coordination normal.  Psychiatric:        Mood and Affect: Mood normal.        Behavior: Behavior normal.     RECENT LABS AND TESTS: BMET No results found for: NA, K, CL, CO2, GLUCOSE, BUN, CREATININE, CALCIUM, GFRNONAA, GFRAA No results found for: HGBA1C No results found for: INSULIN CBC No results found for: WBC, RBC, HGB, HCT, PLT, MCV, MCH, MCHC, RDW, LYMPHSABS, MONOABS, EOSABS, BASOSABS Iron/TIBC/Ferritin/ %Sat No results found for: IRON, TIBC, FERRITIN, IRONPCTSAT Lipid Panel  No results found for: CHOL, TRIG, HDL, CHOLHDL, VLDL, LDLCALC, LDLDIRECT Hepatic Function Panel  No results found for: PROT, ALBUMIN, AST, ALT, ALKPHOS, BILITOT, BILIDIR, IBILI No results found for: TSH  ECG  shows NSR with a rate of 78 BPM INDIRECT CALORIMETER done today shows a VO2 of 193 and a REE of 1343.  Her calculated basal metabolic rate is [id number] thus her basal metabolic rate is worse than expected.       OBESITY BEHAVIORAL INTERVENTION VISIT  Today's visit was # 1   Starting weight: 200 lbs Starting date: 09/03/2018 Today's weight : 200 lbs  Today's date: 09/03/2018 Total lbs lost to date: 0 At least 15 minutes were spent on discussing the following behavioral intervention visit.   ASK: We discussed the diagnosis of obesity with  Erica Henson today and Erica Henson agreed to give us permission to discuss obesity behavioral modification therapy today.  ASSESS: Erica Henson has the diagnosis of obesity and her BMI today is 33.28 Erica Henson is in the action stage of change   ADVISE: Erica Henson was educated on the multiple health risks of obesity as well as the benefit of weight loss to improve her health. She was advised of the need for long term treatment and the importance of lifestyle modifications to improve her current health and to decrease her risk of future health problems.  AGREE: Multiple dietary modification options and treatment options were discussed and  Erica Henson agreed to follow the recommendations documented in the above note.  ARRANGE: Erica Henson was educated on the importance of frequent visits to treat obesity as outlined per CMS and USPSTF guidelines and agreed to schedule her next follow up appointment today.  I, Burt KnackSharon Martin, am acting as transcriptionist for Debbra RidingAlexandria Kadolph, MD   I have reviewed the above documentation for accuracy and completeness, and I agree with the above. - Debbra RidingAlexandria Kadolph, MD

## 2018-09-05 LAB — T4, FREE: Free T4: 1.1 ng/dL (ref 0.82–1.77)

## 2018-09-05 LAB — COMPREHENSIVE METABOLIC PANEL
A/G RATIO: 1.4 (ref 1.2–2.2)
ALT: 12 IU/L (ref 0–32)
AST: 14 IU/L (ref 0–40)
Albumin: 4.1 g/dL (ref 3.8–4.8)
Alkaline Phosphatase: 89 IU/L (ref 39–117)
BUN/Creatinine Ratio: 9 (ref 9–23)
BUN: 7 mg/dL (ref 6–24)
Bilirubin Total: 0.4 mg/dL (ref 0.0–1.2)
CO2: 18 mmol/L — ABNORMAL LOW (ref 20–29)
Calcium: 9.5 mg/dL (ref 8.7–10.2)
Chloride: 103 mmol/L (ref 96–106)
Creatinine, Ser: 0.78 mg/dL (ref 0.57–1.00)
GFR calc Af Amer: 108 mL/min/{1.73_m2} (ref >59)
GFR calc non Af Amer: 93 mL/min/{1.73_m2} (ref >59)
Globulin, Total: 2.9 g/dL (ref 1.5–4.5)
Glucose: 88 mg/dL (ref 65–99)
POTASSIUM: 4.3 mmol/L (ref 3.5–5.2)
Sodium: 137 mmol/L (ref 134–144)
Total Protein: 7 g/dL (ref 6.0–8.5)

## 2018-09-05 LAB — CBC WITH DIFFERENTIAL
BASOS: 0 %
Basophils Absolute: 0 10*3/uL (ref 0.0–0.2)
EOS (ABSOLUTE): 0.1 10*3/uL (ref 0.0–0.4)
Eos: 1 %
Hematocrit: 40.6 % (ref 34.0–46.6)
Hemoglobin: 13.3 g/dL (ref 11.1–15.9)
Immature Grans (Abs): 0 10*3/uL (ref 0.0–0.1)
Immature Granulocytes: 0 %
Lymphocytes Absolute: 2 10*3/uL (ref 0.7–3.1)
Lymphs: 34 %
MCH: 29.2 pg (ref 26.6–33.0)
MCHC: 32.8 g/dL (ref 31.5–35.7)
MCV: 89 fL (ref 79–97)
Monocytes Absolute: 0.4 10*3/uL (ref 0.1–0.9)
Monocytes: 6 %
Neutrophils Absolute: 3.4 10*3/uL (ref 1.4–7.0)
Neutrophils: 59 %
RBC: 4.55 x10E6/uL (ref 3.77–5.28)
RDW: 13.6 % (ref 11.7–15.4)
WBC: 5.8 10*3/uL (ref 3.4–10.8)

## 2018-09-05 LAB — LIPID PANEL WITH LDL/HDL RATIO
Cholesterol, Total: 184 mg/dL (ref 100–199)
HDL: 54 mg/dL (ref >39)
LDL Calculated: 114 mg/dL — ABNORMAL HIGH (ref 0–99)
LDl/HDL Ratio: 2.1 ratio (ref 0.0–3.2)
Triglycerides: 81 mg/dL (ref 0–149)
VLDL Cholesterol Cal: 16 mg/dL (ref 5–40)

## 2018-09-05 LAB — TSH: TSH: 1.81 u[IU]/mL (ref 0.450–4.500)

## 2018-09-05 LAB — T3: T3, Total: 106 ng/dL (ref 71–180)

## 2018-09-05 LAB — INSULIN, RANDOM: INSULIN: 13.6 u[IU]/mL (ref 2.6–24.9)

## 2018-09-05 LAB — HEMOGLOBIN A1C
Est. average glucose Bld gHb Est-mCnc: 97 mg/dL
HEMOGLOBIN A1C: 5 % (ref 4.8–5.6)

## 2018-09-10 DIAGNOSIS — M797 Fibromyalgia: Secondary | ICD-10-CM | POA: Diagnosis not present

## 2018-09-17 ENCOUNTER — Encounter (INDEPENDENT_AMBULATORY_CARE_PROVIDER_SITE_OTHER): Payer: Self-pay | Admitting: Family Medicine

## 2018-09-17 ENCOUNTER — Ambulatory Visit (INDEPENDENT_AMBULATORY_CARE_PROVIDER_SITE_OTHER): Payer: Medicare Other | Admitting: Family Medicine

## 2018-09-17 VITALS — BP 106/74 | HR 73 | Temp 97.9°F | Ht 65.0 in | Wt 197.0 lb

## 2018-09-17 DIAGNOSIS — E559 Vitamin D deficiency, unspecified: Secondary | ICD-10-CM

## 2018-09-17 DIAGNOSIS — Z6832 Body mass index (BMI) 32.0-32.9, adult: Secondary | ICD-10-CM | POA: Diagnosis not present

## 2018-09-17 DIAGNOSIS — E669 Obesity, unspecified: Secondary | ICD-10-CM | POA: Diagnosis not present

## 2018-09-17 DIAGNOSIS — E8881 Metabolic syndrome: Secondary | ICD-10-CM | POA: Diagnosis not present

## 2018-09-17 DIAGNOSIS — E7849 Other hyperlipidemia: Secondary | ICD-10-CM

## 2018-09-17 MED ORDER — METFORMIN HCL 500 MG PO TABS: 500.0000 mg | ORAL_TABLET | Freq: Every day | ORAL | 0 refills | Status: DC

## 2018-09-18 NOTE — Progress Notes (Signed)
Office: [phone]  /  Fax: [phone]   HPI:   Chief Complaint: OBESITY Erica Henson is here to discuss her progress with her obesity treatment plan. She is on the Category 2 plan + 100 calories and is following her eating plan approximately 98 % of the time. She states she is exercising 0 minutes 0 times per week. [name] found the meal plan to be slightly expensive and found it difficult to eat on the go. She is not used to eating meat everyday. She is looking to incorporate rice or potatoes into her diet.  Her weight is 197 lb (89.4 kg) today and has had a weight loss of 3 pounds over a period of 2 weeks since her last visit. She has lost 3 lbs since starting treatment with [place].  Hyperlipidemia [name] has hyperlipidemia and has been trying to improve her cholesterol levels with intensive lifestyle modification including a low saturated fat diet, exercise and weight loss. Her LDL is slightly elevated and HDL is with normal limits. She denies any chest pain, claudication or myalgias.  Insulin Resistance [name] has a diagnosis of insulin resistance based on her elevated fasting insulin level >5. Although [name]'s blood glucose readings are still under good control, insulin resistance puts her at greater risk of metabolic syndrome and diabetes. She is not on metformin and notes carbohydrate cravings. She notes symptoms for at least 1 years. She continues to work on diet and exercise to decrease risk of diabetes.  Vitamin D Deficiency [name] has a diagnosis of vitamin D deficiency. She is currently taking prescription Vit D. She notes fatigue and denies nausea, vomiting or muscle weakness.  ASSESSMENT AND PLAN:  Insulin resistance - Plan: metFORMIN (GLUCOPHAGE) 500 MG tablet  Other hyperlipidemia  Vitamin D deficiency  Class 1 obesity with serious comorbidity and body mass index (BMI) of 32.0 to 32.9 in adult, unspecified obesity type  PLAN:  Hyperlipidemia [name] was  informed of the American Heart Association Guidelines emphasizing intensive lifestyle modifications as the first line treatment for hyperlipidemia. We discussed many lifestyle modifications today in depth, and [name] will continue to work on decreasing saturated fats such as fatty red meat, butter and many fried foods. She will also increase vegetables and lean protein in her diet and continue to work on exercise and weight loss efforts. We will follow up at next appointment. [name] agrees to follow up with our clinic in 2 weeks.  Insulin Resistance [name] will continue to work on weight loss, exercise, and decreasing simple carbohydrates in her diet to help decrease the risk of diabetes. We dicussed metformin including benefits and risks. She was informed that eating too many simple carbohydrates or too many calories at one sitting increases the likelihood of GI side effects. [name] agrees to start metformin 500 mg PO q AM #30 with no refills. [name] agrees to follow up with our clinic in 2 weeks as directed to monitor her progress.  Vitamin D Deficiency [name] was informed that low vitamin D levels contributes to fatigue and are associated with obesity, breast, and colon cancer. [name] agrees to continue taking prescription Vit D @50 ,000 IU every week and will follow up for routine testing of vitamin D, at least 2-3 times per year. She was informed of the risk of over-replacement of vitamin D and agrees to not increase her dose unless she discusses this with [place] first. [name] agrees to follow up with our clinic in 2 weeks.  Obesity [name] is currently in the action stage  of change. As such, her goal is to continue with weight loss efforts She has agreed to follow the Category 2 plan + 100 calories [name] has been instructed to work up to a goal of 150 minutes of combined cardio and strengthening exercise per week for weight loss and overall health benefits. We discussed the  following Behavioral Modification Strategies today: increasing lean protein intake, increasing vegetables and work on meal planning and easy cooking plans, and planning for success   [name] has agreed to follow up with our clinic in 2 weeks. She was informed of the importance of frequent follow up visits to maximize her success with intensive lifestyle modifications for her multiple health conditions.  ALLERGIES: Allergies  Allergen Reactions  . Codeine Itching  . Lunesta [Eszopiclone] Nausea And Vomiting    MEDICATIONS: Current Outpatient Medications on File Prior to Visit  Medication Sig Dispense Refill  . amphetamine-dextroamphetamine (ADDERALL) 10 MG tablet Take 10 mg by mouth daily with breakfast.    . b complex vitamins tablet Take 1 tablet by mouth daily.    Marland Kitchen omeprazole (PRILOSEC) 20 MG capsule Take 20 mg by mouth daily.    . Probiotic Product (PROBIOTIC-10 PO) Take 1 tablet by mouth daily.    . vitamin C (ASCORBIC ACID) 250 MG tablet Take 250 mg by mouth daily.    . Vitamin D, Ergocalciferol, (DRISDOL) 1.25 MG (50000 UT) CAPS capsule Take 50,000 Units by mouth every 7 (seven) days.     No current facility-administered medications on file prior to visit.     PAST MEDICAL HISTORY: Past Medical History:  Diagnosis Date  . Back pain   . Constipation   . Depression   . Fibromyalgia   . Gallbladder problem   . GERD (gastroesophageal reflux disease)   . Hyperlipidemia   . Hypertension   . Joint pain   . Multiple sclerosis (HCC)   . Osteopenia   . Swelling   . Vitamin D deficiency     PAST SURGICAL HISTORY: Past Surgical History:  Procedure Laterality Date  . ABDOMINAL HYSTERECTOMY    . ABDOMINOPLASTY    . BREAST REDUCTION SURGERY    . CHOLECYSTECTOMY    . TMJ ARTHROPLASTY      SOCIAL HISTORY: Social History   Tobacco Use  . Smoking status: Never Smoker  . Smokeless tobacco: Never Used  Substance Use Topics  . Alcohol use: Yes    Comment: social  .  Drug use: Never    FAMILY HISTORY: Family History  Problem Relation Age of Onset  . Diabetes Mother   . Hypertension Mother   . Stroke Father   . Hypertension Father     ROS: Review of Systems  Constitutional: Positive for malaise/fatigue and weight loss.  Cardiovascular: Negative for chest pain and claudication.  Gastrointestinal: Negative for nausea and vomiting.  Musculoskeletal: Negative for myalgias.       Negative muscle weakness    PHYSICAL EXAM: Blood pressure 106/74, pulse 73, temperature 97.9 F (36.6 C), temperature source Oral, height 5\' 5"  (1.651 m), weight 197 lb (89.4 kg), SpO2 100 %. Body mass index is 32.78 kg/m. Physical Exam Vitals signs reviewed.  Constitutional:      Appearance: Normal appearance. She is obese.  Cardiovascular:     Rate and Rhythm: Normal rate.     Pulses: Normal pulses.  Pulmonary:     Effort: Pulmonary effort is normal.     Breath sounds: Normal breath sounds.  Musculoskeletal: Normal range of motion.  Skin:    General: Skin is warm and dry.  Neurological:     Mental Status: She is alert and oriented to person, place, and time.  Psychiatric:        Mood and Affect: Mood normal.        Behavior: Behavior normal.     RECENT LABS AND TESTS: BMET    Component Value Date/Time   NA 137 09/04/2018 0901   K 4.3 09/04/2018 0901   CL 103 09/04/2018 0901   CO2 18 (L) 09/04/2018 0901   GLUCOSE 88 09/04/2018 0901   BUN 7 09/04/2018 0901   CREATININE 0.78 09/04/2018 0901   CALCIUM 9.5 09/04/2018 0901   GFRNONAA 93 09/04/2018 0901   GFRAA 108 09/04/2018 0901   Lab Results  Component Value Date   HGBA1C 5.0 09/04/2018   Lab Results  Component Value Date   INSULIN 13.6 09/04/2018   CBC    Component Value Date/Time   WBC 5.8 09/04/2018 0832   RBC 4.55 09/04/2018 0832   HGB 13.3 09/04/2018 0832   HCT 40.6 09/04/2018 0832   MCV 89 09/04/2018 0832   MCH 29.2 09/04/2018 0832   MCHC 32.8 09/04/2018 0832   RDW 13.6  09/04/2018 0832   LYMPHSABS 2.0 09/04/2018 0832   EOSABS 0.1 09/04/2018 0832   BASOSABS 0.0 09/04/2018 0832   Iron/TIBC/Ferritin/ %Sat No results found for: IRON, TIBC, FERRITIN, IRONPCTSAT Lipid Panel     Component Value Date/Time   CHOL 184 09/04/2018 0832   TRIG 81 09/04/2018 0832   HDL 54 09/04/2018 0832   LDLCALC 114 (H) 09/04/2018 0832   Hepatic Function Panel     Component Value Date/Time   PROT 7.0 09/04/2018 0901   ALBUMIN 4.1 09/04/2018 0901   AST 14 09/04/2018 0901   ALT 12 09/04/2018 0901   ALKPHOS 89 09/04/2018 0901   BILITOT 0.4 09/04/2018 0901      Component Value Date/Time   TSH 1.810 09/04/2018 0901      OBESITY BEHAVIORAL INTERVENTION VISIT  Today's visit was # 2   Starting weight: 200 lbs Starting date: 09/03/2018 Today's weight : 197 lbs Today's date: 09/17/2018 Total lbs lost to date: 3 At least 15 minutes were spent on discussing the following behavioral intervention visit.   ASK: We discussed the diagnosis of obesity with [name] today and Mick Sell agreed to give [place] permission to discuss obesity behavioral modification therapy today.  ASSESS: Druanne has the diagnosis of obesity and her BMI today is 32.78 Avangeline is in the action stage of change   ADVISE: Marina was educated on the multiple health risks of obesity as well as the benefit of weight loss to improve her health. She was advised of the need for long term treatment and the importance of lifestyle modifications to improve her current health and to decrease her risk of future health problems.  AGREE: Multiple dietary modification options and treatment options were discussed and  Maryonna agreed to follow the recommendations documented in the above note.  ARRANGE: Carlos was educated on the importance of frequent visits to treat obesity as outlined per CMS and USPSTF guidelines and agreed to schedule her next follow up appointment today.  I, Burt Knack, am acting  as transcriptionist for Debbra Riding, MD  I have reviewed the above documentation for accuracy and completeness, and I agree with the above. - Debbra Riding, MD

## 2018-09-23 DIAGNOSIS — M25872 Other specified joint disorders, left ankle and foot: Secondary | ICD-10-CM | POA: Diagnosis not present

## 2018-10-02 ENCOUNTER — Ambulatory Visit (INDEPENDENT_AMBULATORY_CARE_PROVIDER_SITE_OTHER): Payer: Medicare Other | Admitting: Family Medicine

## 2018-10-02 ENCOUNTER — Encounter (INDEPENDENT_AMBULATORY_CARE_PROVIDER_SITE_OTHER): Payer: Self-pay | Admitting: Family Medicine

## 2018-10-02 VITALS — BP 102/69 | HR 67 | Temp 97.4°F | Ht 65.0 in | Wt 199.0 lb

## 2018-10-02 DIAGNOSIS — Z6833 Body mass index (BMI) 33.0-33.9, adult: Secondary | ICD-10-CM

## 2018-10-02 DIAGNOSIS — E669 Obesity, unspecified: Secondary | ICD-10-CM

## 2018-10-02 DIAGNOSIS — E88819 Insulin resistance, unspecified: Secondary | ICD-10-CM

## 2018-10-02 DIAGNOSIS — E559 Vitamin D deficiency, unspecified: Secondary | ICD-10-CM

## 2018-10-02 DIAGNOSIS — E8881 Metabolic syndrome: Secondary | ICD-10-CM

## 2018-10-03 NOTE — Progress Notes (Signed)
Office: [phone]  /  Fax: [phone]   HPI:   Chief Complaint: OBESITY Erica Henson is here to discuss her progress with her obesity treatment plan. She is on the Category 2 plan + 100 calories and is following her eating plan approximately 96 % of the time. She states she is walking for 25 minutes 2 times per week. Erica Henson is feeling bloated with quantity of food and needing to take Gas X and indigestion medicine. She did go out twice with her sons and indulged in a milkshake and chicken sandwich. She is looking for alternatives in meat.  Her weight is 199 lb (90.3 kg) today and has gained 2 pounds since her last visit. She has lost 1 lb since starting treatment with [place].  Vitamin D Deficiency Erica Henson has a diagnosis of vitamin D deficiency. She is currently taking prescription Vit D. She notes improving fatigue and denies nausea, vomiting or muscle weakness.  Insulin Resistance Erica Henson has a diagnosis of insulin resistance based on her elevated fasting insulin level >5. Although [name]'s blood glucose readings are still under good control, insulin resistance puts her at greater risk of metabolic syndrome and diabetes. She is taking metformin currently and notes minimal carbohydrate cravings. She notes bloating and denies diarrhea. She continues to work on diet and exercise to decrease risk of diabetes.  ASSESSMENT AND PLAN:  Vitamin D deficiency  Insulin resistance  Class 1 obesity with serious comorbidity and body mass index (BMI) of 33.0 to 33.9 in adult, unspecified obesity type  PLAN:  Vitamin D Deficiency Erica Henson was informed that low vitamin D levels contributes to fatigue and are associated with obesity, breast, and colon cancer. El agrees to continue taking prescription Vit D @50 ,000 IU every week, no refill needed. She will follow up for routine testing of vitamin D, at least 2-3 times per year. She was informed of the risk of over-replacement of vitamin D and  agrees to not increase her dose unless she discusses this with [place] first. [name] agrees to follow up with our clinic in 2 weeks.  Insulin Resistance Erica Henson will continue to work on weight loss, exercise, and decreasing simple carbohydrates in her diet to help decrease the risk of diabetes. We dicussed metformin including benefits and risks. She was informed that eating too many simple carbohydrates or too many calories at one sitting increases the likelihood of GI side effects. Erica Henson wishes to continue taking metformin, and she agrees to follow up with our clinic in 2 weeks as directed to monitor her progress.  I spent > than 50% of the 15 minute visit on counseling as documented in the note.  Obesity Erica Henson is currently in the action stage of change. As such, her goal is to continue with weight loss efforts She has agreed to keep a food journal with 1250-1350 calories and 90+ grams of protein daily [name] has been instructed to work up to a goal of 150 minutes of combined cardio and strengthening exercise per week for weight loss and overall health benefits. We discussed the following Behavioral Modification Strategies today: increasing lean protein intake, increasing vegetables, decrease eating out, better snacking choices, and planning for success   [name] has agreed to follow up with our clinic in 2 weeks. She was informed of the importance of frequent follow up visits to maximize her success with intensive lifestyle modifications for her multiple health conditions.  ALLERGIES: Allergies  Allergen Reactions  . Codeine Itching  . Lunesta [Eszopiclone] Nausea And Vomiting  MEDICATIONS: Current Outpatient Medications on File Prior to Visit  Medication Sig Dispense Refill  . amphetamine-dextroamphetamine (ADDERALL) 10 MG tablet Take 10 mg by mouth daily with breakfast.    . b complex vitamins tablet Take 1 tablet by mouth daily.    . metFORMIN (GLUCOPHAGE) 500 MG tablet  Take 1 tablet (500 mg total) by mouth daily with breakfast. 30 tablet 0  . omeprazole (PRILOSEC) 20 MG capsule Take 20 mg by mouth daily.    . Probiotic Product (PROBIOTIC-10 PO) Take 1 tablet by mouth daily.    . vitamin C (ASCORBIC ACID) 250 MG tablet Take 250 mg by mouth daily.    . Vitamin D, Ergocalciferol, (DRISDOL) 1.25 MG (50000 UT) CAPS capsule Take 50,000 Units by mouth every 7 (seven) days.     No current facility-administered medications on file prior to visit.     PAST MEDICAL HISTORY: Past Medical History:  Diagnosis Date  . Back pain   . Constipation   . Depression   . Fibromyalgia   . Gallbladder problem   . GERD (gastroesophageal reflux disease)   . Hyperlipidemia   . Hypertension   . Joint pain   . Multiple sclerosis (HCC)   . Osteopenia   . Swelling   . Vitamin D deficiency     PAST SURGICAL HISTORY: Past Surgical History:  Procedure Laterality Date  . ABDOMINAL HYSTERECTOMY    . ABDOMINOPLASTY    . BREAST REDUCTION SURGERY    . CHOLECYSTECTOMY    . TMJ ARTHROPLASTY      SOCIAL HISTORY: Social History   Tobacco Use  . Smoking status: Never Smoker  . Smokeless tobacco: Never Used  Substance Use Topics  . Alcohol use: Yes    Comment: social  . Drug use: Never    FAMILY HISTORY: Family History  Problem Relation Age of Onset  . Diabetes Mother   . Hypertension Mother   . Stroke Father   . Hypertension Father     ROS: Review of Systems  Constitutional: Positive for malaise/fatigue. Negative for weight loss.  Gastrointestinal: Negative for diarrhea, nausea and vomiting.  Musculoskeletal:       Negative muscle weakness    PHYSICAL EXAM: Blood pressure 102/69, pulse 67, temperature (!) 97.4 F (36.3 C), temperature source Oral, height 5\' 5"  (1.651 m), weight 199 lb (90.3 kg), SpO2 100 %. Body mass index is 33.12 kg/m. Physical Exam Vitals signs reviewed.  Constitutional:      Appearance: Normal appearance. She is obese.    Cardiovascular:     Rate and Rhythm: Normal rate.     Pulses: Normal pulses.  Pulmonary:     Effort: Pulmonary effort is normal.     Breath sounds: Normal breath sounds.  Musculoskeletal: Normal range of motion.  Skin:    General: Skin is warm and dry.  Neurological:     Mental Status: She is alert and oriented to person, place, and time.  Psychiatric:        Mood and Affect: Mood normal.        Behavior: Behavior normal.     RECENT LABS AND TESTS: BMET    Component Value Date/Time   NA 137 09/04/2018 0901   K 4.3 09/04/2018 0901   CL 103 09/04/2018 0901   CO2 18 (L) 09/04/2018 0901   GLUCOSE 88 09/04/2018 0901   BUN 7 09/04/2018 0901   CREATININE 0.78 09/04/2018 0901   CALCIUM 9.5 09/04/2018 0901   GFRNONAA 93 09/04/2018 0901  GFRAA 108 09/04/2018 0901   Lab Results  Component Value Date   HGBA1C 5.0 09/04/2018   Lab Results  Component Value Date   INSULIN 13.6 09/04/2018   CBC    Component Value Date/Time   WBC 5.8 09/04/2018 0832   RBC 4.55 09/04/2018 0832   HGB 13.3 09/04/2018 0832   HCT 40.6 09/04/2018 0832   MCV 89 09/04/2018 0832   MCH 29.2 09/04/2018 0832   MCHC 32.8 09/04/2018 0832   RDW 13.6 09/04/2018 0832   LYMPHSABS 2.0 09/04/2018 0832   EOSABS 0.1 09/04/2018 0832   BASOSABS 0.0 09/04/2018 0832   Iron/TIBC/Ferritin/ %Sat No results found for: IRON, TIBC, FERRITIN, IRONPCTSAT Lipid Panel     Component Value Date/Time   CHOL 184 09/04/2018 0832   TRIG 81 09/04/2018 0832   HDL 54 09/04/2018 0832   LDLCALC 114 (H) 09/04/2018 0832   Hepatic Function Panel     Component Value Date/Time   PROT 7.0 09/04/2018 0901   ALBUMIN 4.1 09/04/2018 0901   AST 14 09/04/2018 0901   ALT 12 09/04/2018 0901   ALKPHOS 89 09/04/2018 0901   BILITOT 0.4 09/04/2018 0901      Component Value Date/Time   TSH 1.810 09/04/2018 0901      OBESITY BEHAVIORAL INTERVENTION VISIT  Today's visit was # 3   Starting weight: 200 lbs Starting date:  09/03/2018 Today's weight : 199 lbs Today's date: 10/02/2018 Total lbs lost to date: 1    10/02/2018  Height 5\' 5"  (1.651 m)  Weight 199 lb (90.3 kg)  BMI (Calculated) 33.12  BLOOD PRESSURE - SYSTOLIC 102  BLOOD PRESSURE - DIASTOLIC 69   Body Fat % 41 %  Total Body Water (lbs) 77.6 lbs     ASK: We discussed the diagnosis of obesity with [name] today and Mick Sell agreed to give [place] permission to discuss obesity behavioral modification therapy today.  ASSESS: Brittanyann has the diagnosis of obesity and her BMI today is 33.12 Everette is in the action stage of change   ADVISE: Quanteria was educated on the multiple health risks of obesity as well as the benefit of weight loss to improve her health. She was advised of the need for long term treatment and the importance of lifestyle modifications to improve her current health and to decrease her risk of future health problems.  AGREE: Multiple dietary modification options and treatment options were discussed and  Blakely agreed to follow the recommendations documented in the above note.  ARRANGE: Mandee was educated on the importance of frequent visits to treat obesity as outlined per CMS and USPSTF guidelines and agreed to schedule her next follow up appointment today.  I, Burt Knack, am acting as transcriptionist for Debbra Riding, MD  I have reviewed the above documentation for accuracy and completeness, and I agree with the above. - Debbra Riding, MD

## 2018-10-07 ENCOUNTER — Other Ambulatory Visit (INDEPENDENT_AMBULATORY_CARE_PROVIDER_SITE_OTHER): Payer: Self-pay | Admitting: Family Medicine

## 2018-10-07 DIAGNOSIS — E8881 Metabolic syndrome: Secondary | ICD-10-CM

## 2018-10-09 ENCOUNTER — Encounter (INDEPENDENT_AMBULATORY_CARE_PROVIDER_SITE_OTHER): Payer: Self-pay | Admitting: Family Medicine

## 2018-10-15 ENCOUNTER — Other Ambulatory Visit (INDEPENDENT_AMBULATORY_CARE_PROVIDER_SITE_OTHER): Payer: Self-pay | Admitting: Family Medicine

## 2018-10-15 ENCOUNTER — Encounter (INDEPENDENT_AMBULATORY_CARE_PROVIDER_SITE_OTHER): Payer: Self-pay | Admitting: Family Medicine

## 2018-10-15 DIAGNOSIS — E8881 Metabolic syndrome: Secondary | ICD-10-CM

## 2018-10-15 MED ORDER — METFORMIN HCL 500 MG PO TABS: 500.0000 mg | ORAL_TABLET | Freq: Every day | ORAL | 0 refills | Status: DC

## 2018-10-16 DIAGNOSIS — R350 Frequency of micturition: Secondary | ICD-10-CM | POA: Diagnosis not present

## 2018-10-20 ENCOUNTER — Encounter (INDEPENDENT_AMBULATORY_CARE_PROVIDER_SITE_OTHER): Payer: Self-pay | Admitting: Family Medicine

## 2018-10-21 ENCOUNTER — Encounter (INDEPENDENT_AMBULATORY_CARE_PROVIDER_SITE_OTHER): Payer: Self-pay | Admitting: Family Medicine

## 2018-10-21 DIAGNOSIS — M25772 Osteophyte, left ankle: Secondary | ICD-10-CM | POA: Diagnosis not present

## 2018-10-21 DIAGNOSIS — M7662 Achilles tendinitis, left leg: Secondary | ICD-10-CM | POA: Diagnosis not present

## 2018-10-21 DIAGNOSIS — M958 Other specified acquired deformities of musculoskeletal system: Secondary | ICD-10-CM | POA: Diagnosis not present

## 2018-10-21 DIAGNOSIS — M19072 Primary osteoarthritis, left ankle and foot: Secondary | ICD-10-CM | POA: Diagnosis not present

## 2018-10-21 DIAGNOSIS — M7732 Calcaneal spur, left foot: Secondary | ICD-10-CM | POA: Diagnosis not present

## 2018-10-22 ENCOUNTER — Encounter (INDEPENDENT_AMBULATORY_CARE_PROVIDER_SITE_OTHER): Payer: Self-pay

## 2018-10-22 ENCOUNTER — Ambulatory Visit (INDEPENDENT_AMBULATORY_CARE_PROVIDER_SITE_OTHER): Payer: Medicare Other | Admitting: Family Medicine

## 2018-10-22 ENCOUNTER — Other Ambulatory Visit: Payer: Self-pay

## 2018-10-22 ENCOUNTER — Encounter (INDEPENDENT_AMBULATORY_CARE_PROVIDER_SITE_OTHER): Payer: Self-pay | Admitting: Family Medicine

## 2018-10-22 DIAGNOSIS — Z6833 Body mass index (BMI) 33.0-33.9, adult: Secondary | ICD-10-CM

## 2018-10-22 DIAGNOSIS — E559 Vitamin D deficiency, unspecified: Secondary | ICD-10-CM | POA: Diagnosis not present

## 2018-10-22 DIAGNOSIS — E669 Obesity, unspecified: Secondary | ICD-10-CM | POA: Diagnosis not present

## 2018-10-22 DIAGNOSIS — E8881 Metabolic syndrome: Secondary | ICD-10-CM | POA: Diagnosis not present

## 2018-10-22 MED ORDER — VITAMIN D (ERGOCALCIFEROL) 1.25 MG (50000 UNIT) PO CAPS: 50000.0000 [IU] | ORAL_CAPSULE | ORAL | 0 refills | Status: DC

## 2018-10-23 NOTE — Progress Notes (Signed)
Office: [phone]  /  Fax: [phone] TeleHealth Visit:  Erica Henson has consented to this TeleHealth visit today via telephone. The patient is located at home, the provider is located at the [facility] office. The participants in this visit include the listed provider and patient and provider's assistant. Time spent on visit was 20 minutes  HPI:   Chief Complaint: OBESITY Erica Henson is here to discuss her progress with her obesity treatment plan. She is on the keep a food journal with 1250-1350 calories and 90+ grams of protein daily and is following her eating plan approximately 94 % of the time. She states she is moderately walking. Erica Henson voices concern over sodium in microwave meals. She also hasn't been able to exercise secondary to ankle pain. She was told by her Orthopedist specialist that she is unable to get another steroid injection.  We were unable to weight the patient today for this TeleHealth visit. She feels as if she has lost 2 to 3 lbs since her last visit. She has lost 1 to 3 lbs since starting treatment with [place].  Vitamin D Deficiency Erica Henson has a diagnosis of vitamin D deficiency. She is currently taking prescription Vit D. She notes fatigue and denies nausea, vomiting or muscle weakness.  Insulin Resistance Erica Henson has a diagnosis of insulin resistance based on her elevated fasting insulin level >5. Although Erica Henson's blood glucose readings are still under good control, insulin resistance puts her at greater risk of metabolic syndrome and diabetes. She is taking metformin currently and notes carbohydrate cravings. She continues to work on diet and exercise to decrease risk of diabetes.  ASSESSMENT AND PLAN:  Vitamin D deficiency - Plan: Vitamin D, Ergocalciferol, (DRISDOL) 1.25 MG (50000 UT) CAPS capsule  Insulin resistance  Class 1 obesity with serious comorbidity and body mass index (BMI) of 33.0 to 33.9 in adult, unspecified obesity type   PLAN:  Vitamin D Deficiency Erica Henson was informed that low vitamin D levels contributes to fatigue and are associated with obesity, breast, and colon cancer. [name] agrees to continue taking prescription Vit D @50 ,000 IU every week #4 and we will refill for 1 month. She will follow up for routine testing of vitamin D, at least 2-3 times per year. She was informed of the risk of over-replacement of vitamin D and agrees to not increase her dose unless she discusses this with [place] first. [name] agrees to follow up with our clinic in 2 weeks.  Insulin Resistance Erica Henson will continue to work on weight loss, exercise, and decreasing simple carbohydrates in her diet to help decrease the risk of diabetes. We dicussed metformin including benefits and risks. She was informed that eating too many simple carbohydrates or too many calories at one sitting increases the likelihood of GI side effects. Erica Henson agrees to continue taking metformin and we will repeat labs in mid May. Erica Henson agrees to follow up with our clinic in 2 weeks as directed to monitor her progress.  Obesity Erica Henson is currently in the action stage of change. As such, her goal is to continue with weight loss efforts She has agreed to keep a food journal with 1250-1350 calories and 90+ grams of protein daily Erica Henson has been instructed to work up to a goal of 150 minutes of combined cardio and strengthening exercise per week for weight loss and overall health benefits. We discussed the following Behavioral Modification Strategies today: increasing lean protein intake, increasing vegetables and work on meal planning and easy cooking plans,  and planning for success   Erica Henson has agreed to follow up with our clinic in 2 weeks. She was informed of the importance of frequent follow up visits to maximize her success with intensive lifestyle modifications for her multiple health conditions.  ALLERGIES: Allergies  Allergen Reactions  .  Codeine Itching  . Lunesta [Eszopiclone] Nausea And Vomiting    MEDICATIONS: Current Outpatient Medications on File Prior to Visit  Medication Sig Dispense Refill  . amphetamine-dextroamphetamine (ADDERALL) 10 MG tablet Take 10 mg by mouth daily with breakfast.    . b complex vitamins tablet Take 1 tablet by mouth daily.    . metFORMIN (GLUCOPHAGE) 500 MG tablet Take 1 tablet (500 mg total) by mouth daily with breakfast. 30 tablet 0  . omeprazole (PRILOSEC) 20 MG capsule Take 20 mg by mouth daily.    . Probiotic Product (PROBIOTIC-10 PO) Take 1 tablet by mouth daily.    . vitamin C (ASCORBIC ACID) 250 MG tablet Take 250 mg by mouth daily.     No current facility-administered medications on file prior to visit.     PAST MEDICAL HISTORY: Past Medical History:  Diagnosis Date  . Back pain   . Constipation   . Depression   . Fibromyalgia   . Gallbladder problem   . GERD (gastroesophageal reflux disease)   . Hyperlipidemia   . Hypertension   . Joint pain   . Multiple sclerosis (HCC)   . Osteopenia   . Swelling   . Vitamin D deficiency     PAST SURGICAL HISTORY: Past Surgical History:  Procedure Laterality Date  . ABDOMINAL HYSTERECTOMY    . ABDOMINOPLASTY    . BREAST REDUCTION SURGERY    . CHOLECYSTECTOMY    . TMJ ARTHROPLASTY      SOCIAL HISTORY: Social History   Tobacco Use  . Smoking status: Never Smoker  . Smokeless tobacco: Never Used  Substance Use Topics  . Alcohol use: Yes    Comment: social  . Drug use: Never    FAMILY HISTORY: Family History  Problem Relation Age of Onset  . Diabetes Mother   . Hypertension Mother   . Stroke Father   . Hypertension Father     ROS: Review of Systems  Constitutional: Positive for malaise/fatigue and weight loss.  Gastrointestinal: Negative for nausea and vomiting.  Musculoskeletal:       Negative muscle weakness    PHYSICAL EXAM: Pt in no acute distress  RECENT LABS AND TESTS: BMET    Component Value  Date/Time   NA 137 09/04/2018 0901   K 4.3 09/04/2018 0901   CL 103 09/04/2018 0901   CO2 18 (L) 09/04/2018 0901   GLUCOSE 88 09/04/2018 0901   BUN 7 09/04/2018 0901   CREATININE 0.78 09/04/2018 0901   CALCIUM 9.5 09/04/2018 0901   GFRNONAA 93 09/04/2018 0901   GFRAA 108 09/04/2018 0901   Lab Results  Component Value Date   HGBA1C 5.0 09/04/2018   Lab Results  Component Value Date   INSULIN 13.6 09/04/2018   CBC    Component Value Date/Time   WBC 5.8 09/04/2018 0832   RBC 4.55 09/04/2018 0832   HGB 13.3 09/04/2018 0832   HCT 40.6 09/04/2018 0832   MCV 89 09/04/2018 0832   MCH 29.2 09/04/2018 0832   MCHC 32.8 09/04/2018 0832   RDW 13.6 09/04/2018 0832   LYMPHSABS 2.0 09/04/2018 0832   EOSABS 0.1 09/04/2018 0832   BASOSABS 0.0 09/04/2018 0832   Iron/TIBC/Ferritin/ %Sat  No results found for: IRON, TIBC, FERRITIN, IRONPCTSAT Lipid Panel     Component Value Date/Time   CHOL 184 09/04/2018 0832   TRIG 81 09/04/2018 0832   HDL 54 09/04/2018 0832   LDLCALC 114 (H) 09/04/2018 0832   Hepatic Function Panel     Component Value Date/Time   PROT 7.0 09/04/2018 0901   ALBUMIN 4.1 09/04/2018 0901   AST 14 09/04/2018 0901   ALT 12 09/04/2018 0901   ALKPHOS 89 09/04/2018 0901   BILITOT 0.4 09/04/2018 0901      Component Value Date/Time   TSH 1.810 09/04/2018 0901      I, Burt Knack, am acting as transcriptionist for Debbra Riding, MD  I have reviewed the above documentation for accuracy and completeness, and I agree with the above. - Debbra Riding, MD

## 2018-11-06 ENCOUNTER — Other Ambulatory Visit: Payer: Self-pay

## 2018-11-06 ENCOUNTER — Ambulatory Visit (INDEPENDENT_AMBULATORY_CARE_PROVIDER_SITE_OTHER): Payer: Medicare Other | Admitting: Family Medicine

## 2018-11-06 ENCOUNTER — Encounter (INDEPENDENT_AMBULATORY_CARE_PROVIDER_SITE_OTHER): Payer: Self-pay | Admitting: Family Medicine

## 2018-11-06 DIAGNOSIS — E8881 Metabolic syndrome: Secondary | ICD-10-CM | POA: Diagnosis not present

## 2018-11-06 DIAGNOSIS — E559 Vitamin D deficiency, unspecified: Secondary | ICD-10-CM | POA: Diagnosis not present

## 2018-11-06 DIAGNOSIS — Z6833 Body mass index (BMI) 33.0-33.9, adult: Secondary | ICD-10-CM

## 2018-11-06 DIAGNOSIS — E669 Obesity, unspecified: Secondary | ICD-10-CM

## 2018-11-06 NOTE — Progress Notes (Signed)
Office: [phone]  /  Fax: [phone] TeleHealth Visit:  Erica Henson has verbally consented to this TeleHealth visit today. The patient is located at home, the provider is located at the [facility] office. The participants in this visit include the listed provider and patient and provider's assistant. The visit was conducted today via webex.  HPI:   Chief Complaint: OBESITY [name] is here to discuss her progress with her obesity treatment plan. She is on the keep a food journal with 1250-1350 calories and 90+ grams of protein daily and is following her eating plan approximately 80 % of the time. She states she is walking for 35-40 minutes 3 times per week. [name] is consistently getting all protein and calories in. She has started walking, but recently had an MRI which showed broken cartilage in right ankle. She is often getting more protein than she needs and is staying within calories.  We were unable to weigh the patient today for this TeleHealth visit. She feels as if she has lost 2 lbs since her last visit. She has lost 1-3 lbs since starting treatment with [place].  Insulin Resistance [name] has a diagnosis of insulin resistance based on her elevated fasting insulin level >5. Although [name]'s blood glucose readings are still under good control, insulin resistance puts her at greater risk of metabolic syndrome and diabetes. She notes carbohydrate cravings occasionally and denies GI side effects of metformin. She continues to work on diet and exercise to decrease risk of diabetes.  Vitamin D Deficiency [name] has a diagnosis of vitamin D deficiency. She is currently taking prescription Vit D. She notes fatigue and denies nausea, vomiting or muscle weakness.  ASSESSMENT AND PLAN:  Insulin resistance  Vitamin D deficiency  Class 1 obesity with serious comorbidity and body mass index (BMI) of 33.0 to 33.9 in adult, unspecified obesity type  PLAN:  Insulin  Resistance Claudette will continue to work on weight loss, exercise, and decreasing simple carbohydrates in her diet to help decrease the risk of diabetes. We dicussed metformin including benefits and risks. She was informed that eating too many simple carbohydrates or too many calories at one sitting increases the likelihood of GI side effects. Zymia agrees to continue taking metformin, and she agrees to follow up with our clinic in 2 weeks as directed to monitor her progress.  Vitamin D Deficiency [name] was informed that low vitamin D levels contributes to fatigue and are associated with obesity, breast, and colon cancer. [name] agrees to continue taking prescription Vit D @50 ,000 IU every week and will follow up for routine testing of vitamin D, at least 2-3 times per year. She was informed of the risk of over-replacement of vitamin D and agrees to not increase her dose unless she discusses this with [place] first. [name] agrees to follow up with our clinic in 2 weeks.  Obesity [name] is currently in the action stage of change. As such, her goal is to continue with weight loss efforts She has agreed to keep a food journal with 1150-1250 calories and 80+ grams of protein daily [name] has been instructed to work up to a goal of 150 minutes of combined cardio and strengthening exercise per week for weight loss and overall health benefits. We discussed the following Behavioral Modification Strategies today: increasing lean protein intake, increasing vegetables, work on meal planning and easy cooking plans, ways to avoid boredom eating, avoiding temptations, planning for success, and keep a strict food journal   [name] has  agreed to follow up with our clinic in 2 weeks. She was informed of the importance of frequent follow up visits to maximize her success with intensive lifestyle modifications for her multiple health conditions.  ALLERGIES: Allergies  Allergen Reactions  . Codeine Itching   . Lunesta [Eszopiclone] Nausea And Vomiting    MEDICATIONS: Current Outpatient Medications on File Prior to Visit  Medication Sig Dispense Refill  . amphetamine-dextroamphetamine (ADDERALL) 10 MG tablet Take 10 mg by mouth daily with breakfast.    . b complex vitamins tablet Take 1 tablet by mouth daily.    . metFORMIN (GLUCOPHAGE) 500 MG tablet Take 1 tablet (500 mg total) by mouth daily with breakfast. 30 tablet 0  . omeprazole (PRILOSEC) 20 MG capsule Take 20 mg by mouth daily.    . Probiotic Product (PROBIOTIC-10 PO) Take 1 tablet by mouth daily.    . vitamin C (ASCORBIC ACID) 250 MG tablet Take 250 mg by mouth daily.    . Vitamin D, Ergocalciferol, (DRISDOL) 1.25 MG (50000 UT) CAPS capsule Take 1 capsule (50,000 Units total) by mouth every 7 (seven) days. 4 capsule 0   No current facility-administered medications on file prior to visit.     PAST MEDICAL HISTORY: Past Medical History:  Diagnosis Date  . Back pain   . Constipation   . Depression   . Fibromyalgia   . Gallbladder problem   . GERD (gastroesophageal reflux disease)   . Hyperlipidemia   . Hypertension   . Joint pain   . Multiple sclerosis (HCC)   . Osteopenia   . Swelling   . Vitamin D deficiency     PAST SURGICAL HISTORY: Past Surgical History:  Procedure Laterality Date  . ABDOMINAL HYSTERECTOMY    . ABDOMINOPLASTY    . BREAST REDUCTION SURGERY    . CHOLECYSTECTOMY    . TMJ ARTHROPLASTY      SOCIAL HISTORY: Social History   Tobacco Use  . Smoking status: Never Smoker  . Smokeless tobacco: Never Used  Substance Use Topics  . Alcohol use: Yes    Comment: social  . Drug use: Never    FAMILY HISTORY: Family History  Problem Relation Age of Onset  . Diabetes Mother   . Hypertension Mother   . Stroke Father   . Hypertension Father     ROS: Review of Systems  Constitutional: Positive for malaise/fatigue and weight loss.  Gastrointestinal: Negative for nausea and vomiting.   Musculoskeletal:       Negative muscle weakness    PHYSICAL EXAM: Pt in no acute distress  RECENT LABS AND TESTS: BMET    Component Value Date/Time   NA 137 09/04/2018 0901   K 4.3 09/04/2018 0901   CL 103 09/04/2018 0901   CO2 18 (L) 09/04/2018 0901   GLUCOSE 88 09/04/2018 0901   BUN 7 09/04/2018 0901   CREATININE 0.78 09/04/2018 0901   CALCIUM 9.5 09/04/2018 0901   GFRNONAA 93 09/04/2018 0901   GFRAA 108 09/04/2018 0901   Lab Results  Component Value Date   HGBA1C 5.0 09/04/2018   Lab Results  Component Value Date   INSULIN 13.6 09/04/2018   CBC    Component Value Date/Time   WBC 5.8 09/04/2018 0832   RBC 4.55 09/04/2018 0832   HGB 13.3 09/04/2018 0832   HCT 40.6 09/04/2018 0832   MCV 89 09/04/2018 0832   MCH 29.2 09/04/2018 0832   MCHC 32.8 09/04/2018 0832   RDW 13.6 09/04/2018 0832   LYMPHSABS  2.0 09/04/2018 0832   EOSABS 0.1 09/04/2018 0832   BASOSABS 0.0 09/04/2018 0832   Iron/TIBC/Ferritin/ %Sat No results found for: IRON, TIBC, FERRITIN, IRONPCTSAT Lipid Panel     Component Value Date/Time   CHOL 184 09/04/2018 0832   TRIG 81 09/04/2018 0832   HDL 54 09/04/2018 0832   LDLCALC 114 (H) 09/04/2018 0832   Hepatic Function Panel     Component Value Date/Time   PROT 7.0 09/04/2018 0901   ALBUMIN 4.1 09/04/2018 0901   AST 14 09/04/2018 0901   ALT 12 09/04/2018 0901   ALKPHOS 89 09/04/2018 0901   BILITOT 0.4 09/04/2018 0901      Component Value Date/Time   TSH 1.810 09/04/2018 0901      I, Burt Knack, am acting as transcriptionist for Debbra Riding, MD  I have reviewed the above documentation for accuracy and completeness, and I agree with the above. - Debbra Riding, MD

## 2018-11-12 ENCOUNTER — Other Ambulatory Visit (INDEPENDENT_AMBULATORY_CARE_PROVIDER_SITE_OTHER): Payer: Self-pay | Admitting: Family Medicine

## 2018-11-12 DIAGNOSIS — E88819 Insulin resistance, unspecified: Secondary | ICD-10-CM

## 2018-11-12 DIAGNOSIS — E559 Vitamin D deficiency, unspecified: Secondary | ICD-10-CM

## 2018-11-12 DIAGNOSIS — M25872 Other specified joint disorders, left ankle and foot: Secondary | ICD-10-CM | POA: Diagnosis not present

## 2018-11-12 DIAGNOSIS — E8881 Metabolic syndrome: Secondary | ICD-10-CM

## 2018-11-14 DIAGNOSIS — N898 Other specified noninflammatory disorders of vagina: Secondary | ICD-10-CM | POA: Diagnosis not present

## 2018-11-14 DIAGNOSIS — R300 Dysuria: Secondary | ICD-10-CM | POA: Diagnosis not present

## 2018-11-20 ENCOUNTER — Other Ambulatory Visit (INDEPENDENT_AMBULATORY_CARE_PROVIDER_SITE_OTHER): Payer: Self-pay | Admitting: Family Medicine

## 2018-11-20 ENCOUNTER — Other Ambulatory Visit: Payer: Self-pay

## 2018-11-20 ENCOUNTER — Ambulatory Visit (INDEPENDENT_AMBULATORY_CARE_PROVIDER_SITE_OTHER): Payer: Medicare Other | Admitting: Family Medicine

## 2018-11-20 ENCOUNTER — Encounter (INDEPENDENT_AMBULATORY_CARE_PROVIDER_SITE_OTHER): Payer: Self-pay | Admitting: Family Medicine

## 2018-11-20 DIAGNOSIS — E669 Obesity, unspecified: Secondary | ICD-10-CM | POA: Diagnosis not present

## 2018-11-20 DIAGNOSIS — E559 Vitamin D deficiency, unspecified: Secondary | ICD-10-CM

## 2018-11-20 DIAGNOSIS — E8881 Metabolic syndrome: Secondary | ICD-10-CM | POA: Diagnosis not present

## 2018-11-20 DIAGNOSIS — Z6833 Body mass index (BMI) 33.0-33.9, adult: Secondary | ICD-10-CM | POA: Diagnosis not present

## 2018-11-21 ENCOUNTER — Other Ambulatory Visit (INDEPENDENT_AMBULATORY_CARE_PROVIDER_SITE_OTHER): Payer: Self-pay | Admitting: Family Medicine

## 2018-11-21 DIAGNOSIS — E559 Vitamin D deficiency, unspecified: Secondary | ICD-10-CM

## 2018-11-21 MED ORDER — VITAMIN D (ERGOCALCIFEROL) 1.25 MG (50000 UNIT) PO CAPS: 50000.0000 [IU] | ORAL_CAPSULE | ORAL | 0 refills | Status: DC

## 2018-11-25 NOTE — Progress Notes (Signed)
Office: [phone]  /  Fax: [phone] TeleHealth Visit:  Erica Henson has verbally consented to this TeleHealth visit today. The patient is located at home, the provider is located at the [facility] office. The participants in this visit include the listed provider and patient. The visit was conducted today via webex.  HPI:   Chief Complaint: OBESITY [name] is here to discuss her progress with her obesity treatment plan. She is on the keep a food journal with 1150-1250 calories and 80+ grams of protein daily and is following her eating plan approximately 97 % of the time. She states she is walking a small amount. 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 She is not having any pain relief from the injection yet. She is starting to feel confined at home. She is enjoying journaling and putting food together by herself.  We were unable to weigh the patient today for this TeleHealth visit. She feels as if she has lost weight since her last visit. She has lost 1 lb since starting treatment with [place] and weighed in at 194lb today.  Vitamin D Deficiency Erica Henson has a diagnosis of vitamin D deficiency. She is currently taking prescription Vit D. She notes fatigue and denies nausea, vomiting or muscle weakness.  Insulin Resistance Erica Henson has a diagnosis of insulin resistance based on her elevated fasting insulin level >5. Although [name]'s blood glucose readings are still under good control, insulin resistance puts her at greater risk of metabolic syndrome and diabetes. She is taking metformin currently and denies GI side effects. She continues to work on diet and exercise to decrease risk of diabetes.  ASSESSMENT AND PLAN:  Vitamin D deficiency - Plan: Vitamin D, Ergocalciferol, (DRISDOL) 1.25 MG (50000 UT) CAPS capsule  Insulin resistance  Class 1 obesity with serious comorbidity and body mass index (BMI) of 33.0 to 33.9 in adult, unspecified obesity  type  PLAN:  Vitamin D Deficiency Erica Henson was informed that low vitamin D levels contributes to fatigue and are associated with obesity, breast, and colon cancer. Erica Henson agrees to continue taking prescription Vit D @50 ,000 IU every week #4 and we will refill for 1 month. She will follow up for routine testing of vitamin D, at least 2-3 times per year. She was informed of the risk of over-replacement of vitamin D and agrees to not increase her dose unless she discusses this with [place] first. Erica Henson agrees to follow up with our clinic in 2 weeks.  Insulin Resistance Erica Henson will continue to work on weight loss, exercise, and decreasing simple carbohydrates in her diet to help decrease the risk of diabetes. We dicussed metformin including benefits and risks. She was informed that eating too many simple carbohydrates or too many calories at one sitting increases the likelihood of GI side effects. Erica Henson agrees to continue taking metformin and we will repeat labs at next appointment. Erica Henson agrees to follow up with our clinic in 2 weeks as directed to monitor her progress.  Obesity Erica Henson is currently in the action stage of change. As such, her goal is to continue with weight loss efforts She has agreed to keep a food journal with 1150-1250 calories and 80+ grams of protein daily or follow the Category 2 plan [name] has been instructed to work up to a goal of 150 minutes of combined cardio and strengthening exercise per week for weight loss and overall health benefits. We discussed the following Behavioral Modification Strategies today: increasing lean protein intake, work on meal  planning and easy cooking plans and emotional eating strategies, better snacking choices, planning for success, and keep a strict food journal   Erica Henson has agreed to follow up with our clinic in 2 weeks. She was informed of the importance of frequent follow up visits to maximize her success with intensive  lifestyle modifications for her multiple health conditions.  ALLERGIES: Allergies  Allergen Reactions  . Codeine Itching  . Lunesta [Eszopiclone] Nausea And Vomiting    MEDICATIONS: Current Outpatient Medications on File Prior to Visit  Medication Sig Dispense Refill  . amphetamine-dextroamphetamine (ADDERALL) 10 MG tablet Take 10 mg by mouth daily with breakfast.    . b complex vitamins tablet Take 1 tablet by mouth daily.    . metFORMIN (GLUCOPHAGE) 500 MG tablet Take 1 tablet (500 mg total) by mouth daily with breakfast. 30 tablet 0  . omeprazole (PRILOSEC) 20 MG capsule Take 20 mg by mouth daily.    . Probiotic Product (PROBIOTIC-10 PO) Take 1 tablet by mouth daily.    . vitamin C (ASCORBIC ACID) 250 MG tablet Take 250 mg by mouth daily.     No current facility-administered medications on file prior to visit.     PAST MEDICAL HISTORY: Past Medical History:  Diagnosis Date  . Back pain   . Constipation   . Depression   . Fibromyalgia   . Gallbladder problem   . GERD (gastroesophageal reflux disease)   . Hyperlipidemia   . Hypertension   . Joint pain   . Multiple sclerosis (HCC)   . Osteopenia   . Swelling   . Vitamin D deficiency     PAST SURGICAL HISTORY: Past Surgical History:  Procedure Laterality Date  . ABDOMINAL HYSTERECTOMY    . ABDOMINOPLASTY    . BREAST REDUCTION SURGERY    . CHOLECYSTECTOMY    . TMJ ARTHROPLASTY      SOCIAL HISTORY: Social History   Tobacco Use  . Smoking status: Never Smoker  . Smokeless tobacco: Never Used  Substance Use Topics  . Alcohol use: Yes    Comment: social  . Drug use: Never    FAMILY HISTORY: Family History  Problem Relation Age of Onset  . Diabetes Mother   . Hypertension Mother   . Stroke Father   . Hypertension Father     ROS: Review of Systems  Constitutional: Positive for malaise/fatigue and weight loss.  Gastrointestinal: Negative for nausea and vomiting.  Musculoskeletal:       Negative  muscle weakness    PHYSICAL EXAM: Pt in no acute distress  RECENT LABS AND TESTS: BMET    Component Value Date/Time   NA 137 09/04/2018 0901   K 4.3 09/04/2018 0901   CL 103 09/04/2018 0901   CO2 18 (L) 09/04/2018 0901   GLUCOSE 88 09/04/2018 0901   BUN 7 09/04/2018 0901   CREATININE 0.78 09/04/2018 0901   CALCIUM 9.5 09/04/2018 0901   GFRNONAA 93 09/04/2018 0901   GFRAA 108 09/04/2018 0901   Lab Results  Component Value Date   HGBA1C 5.0 09/04/2018   Lab Results  Component Value Date   INSULIN 13.6 09/04/2018   CBC    Component Value Date/Time   WBC 5.8 09/04/2018 0832   RBC 4.55 09/04/2018 0832   HGB 13.3 09/04/2018 0832   HCT 40.6 09/04/2018 0832   MCV 89 09/04/2018 0832   MCH 29.2 09/04/2018 0832   MCHC 32.8 09/04/2018 0832   RDW 13.6 09/04/2018 0832   LYMPHSABS 2.0 09/04/2018  0832   EOSABS 0.1 09/04/2018 0832   BASOSABS 0.0 09/04/2018 0832   Iron/TIBC/Ferritin/ %Sat No results found for: IRON, TIBC, FERRITIN, IRONPCTSAT Lipid Panel     Component Value Date/Time   CHOL 184 09/04/2018 0832   TRIG 81 09/04/2018 0832   HDL 54 09/04/2018 0832   LDLCALC 114 (H) 09/04/2018 0832   Hepatic Function Panel     Component Value Date/Time   PROT 7.0 09/04/2018 0901   ALBUMIN 4.1 09/04/2018 0901   AST 14 09/04/2018 0901   ALT 12 09/04/2018 0901   ALKPHOS 89 09/04/2018 0901   BILITOT 0.4 09/04/2018 0901      Component Value Date/Time   TSH 1.810 09/04/2018 0901      I, Burt KnackSharon Martin, am acting as transcriptionist for Debbra RidingAlexandria Kadolph, MD  I have reviewed the above documentation for accuracy and completeness, and I agree with the above. - Debbra RidingAlexandria Kadolph, MD

## 2018-12-02 DIAGNOSIS — M25872 Other specified joint disorders, left ankle and foot: Secondary | ICD-10-CM | POA: Diagnosis not present

## 2018-12-04 ENCOUNTER — Other Ambulatory Visit: Payer: Self-pay

## 2018-12-04 ENCOUNTER — Ambulatory Visit (INDEPENDENT_AMBULATORY_CARE_PROVIDER_SITE_OTHER): Payer: Medicare Other | Admitting: Family Medicine

## 2018-12-04 ENCOUNTER — Encounter (INDEPENDENT_AMBULATORY_CARE_PROVIDER_SITE_OTHER): Payer: Self-pay | Admitting: Family Medicine

## 2018-12-04 DIAGNOSIS — E669 Obesity, unspecified: Secondary | ICD-10-CM

## 2018-12-04 DIAGNOSIS — E559 Vitamin D deficiency, unspecified: Secondary | ICD-10-CM | POA: Diagnosis not present

## 2018-12-04 DIAGNOSIS — Z6833 Body mass index (BMI) 33.0-33.9, adult: Secondary | ICD-10-CM | POA: Diagnosis not present

## 2018-12-04 DIAGNOSIS — E8881 Metabolic syndrome: Secondary | ICD-10-CM

## 2018-12-04 DIAGNOSIS — E7849 Other hyperlipidemia: Secondary | ICD-10-CM | POA: Diagnosis not present

## 2018-12-04 NOTE — Progress Notes (Signed)
Office: [phone]  /  Fax: [phone] TeleHealth Visit:  Erica Henson has verbally consented to this TeleHealth visit today. The patient is located at home, the provider is located at the [facility] office. The participants in this visit include the listed provider and patient. The visit was conducted today via webex.  HPI:   Chief Complaint: OBESITY Erica Henson is here to discuss her progress with her obesity treatment plan. She is on the keep a food journal with 1150-1250 calories and 80+ grams of protein daily or follow the Category 2 plan and is following her eating plan approximately 97 % of the time. She states she is doing dance aerobics and yoga for 30-60 minutes 3 times per week. [name] voices the past few weeks have not been difficult, but just stressful. She is voicing difficulty getting creative with meats secondary to minimal availability of meat. She is weighing in at 194 lbs.  We were unable to weigh the patient today for this TeleHealth visit. She feels as if she has maintained her weight since her last visit. She has lost 1 lb since starting treatment with Erica Henson.  Insulin Resistance [name] has a diagnosis of insulin resistance based on her elevated fasting insulin level >5. Last insulin was >10 and Hgb A1c of 5.0. Although [name]'s blood glucose readings are still under good control, insulin resistance puts her at greater risk of metabolic syndrome and diabetes. She denies GI side effects of metformin and continues to work on diet and exercise to decrease risk of diabetes.  Hyperlipidemia [name] has hyperlipidemia and has been trying to improve her cholesterol levels with intensive lifestyle modification including a low saturated fat diet, exercise and weight loss. Last LDL was of 114 and she is not on medications. She denies any chest pain, claudication or myalgias.  Vitamin D Deficiency [name] has a diagnosis of vitamin D deficiency. She is  currently taking prescription Vit D. She notes fatigue and denies nausea, vomiting or muscle weakness.  ASSESSMENT AND PLAN:  Insulin resistance - Plan: Hemoglobin A1c, Insulin, random  Other hyperlipidemia - Plan: Lipid Panel With LDL/HDL Ratio  Vitamin D deficiency - Plan: VITAMIN D 25 Hydroxy (Vit-D Deficiency, Fractures)  Class 1 obesity with serious comorbidity and body mass index (BMI) of 33.0 to 33.9 in adult, unspecified obesity type  PLAN:  Insulin Resistance [name] will continue to work on weight loss, exercise, and decreasing simple carbohydrates in her diet to help decrease the risk of diabetes. We dicussed metformin including benefits and risks. She was informed that eating too many simple carbohydrates or too many calories at one sitting increases the likelihood of GI side effects. [name] agrees to continue taking metformin, no refill needed, and we will repeat Hgb A1c and insulin today. [name] agrees to follow up with our clinic in 2 weeks as directed to monitor her progress.  Hyperlipidemia [name] was informed of the American Heart Association Guidelines emphasizing intensive lifestyle modifications as the first line treatment for hyperlipidemia. We discussed many lifestyle modifications today in depth, and [name] will continue to work on decreasing saturated fats such as fatty red meat, butter and many fried foods. She will also increase vegetables and lean protein in her diet and continue to work on exercise and weight loss efforts. We will repeat FLP today. [name] agrees to follow up with our clinic in 2 weeks.  Vitamin D Deficiency [name] was informed that low vitamin D levels contributes to fatigue and are associated with obesity, breast,  and colon cancer. [name] agrees to continue taking prescription Vit D @50 ,000 IU every week and will follow up for routine testing of vitamin D, at least 2-3 times per year. She was informed of the risk of over-replacement  of vitamin D and agrees to not increase her dose unless she discusses this with Erica Henson first. We will repeat Vit D level today. [name] agrees to follow up with our clinic in 2 weeks.  Obesity [name] is currently in the action stage of change. As such, her goal is to continue with weight loss efforts She has agreed to keep a food journal with 1250-1350 calories and 85+ grams of protein daily [name] has been instructed to work up to a goal of 150 minutes of combined cardio and strengthening exercise per week for weight loss and overall health benefits. We discussed the following Behavioral Modification Strategies today: increasing lean protein intake, increasing vegetables and work on meal planning and easy cooking plans, and planning for success Repeat IC at next in person appointment.  [name] has agreed to follow up with our clinic in 2 weeks. She was informed of the importance of frequent follow up visits to maximize her success with intensive lifestyle modifications for her multiple health conditions.  ALLERGIES: Allergies  Allergen Reactions  . Codeine Itching  . Lunesta [Eszopiclone] Nausea And Vomiting    MEDICATIONS: Current Outpatient Medications on File Prior to Visit  Medication Sig Dispense Refill  . amphetamine-dextroamphetamine (ADDERALL) 10 MG tablet Take 10 mg by mouth daily with breakfast.    . b complex vitamins tablet Take 1 tablet by mouth daily.    . metFORMIN (GLUCOPHAGE) 500 MG tablet Take 1 tablet (500 mg total) by mouth daily with breakfast. 30 tablet 0  . omeprazole (PRILOSEC) 20 MG capsule Take 20 mg by mouth daily.    . Probiotic Product (PROBIOTIC-10 PO) Take 1 tablet by mouth daily.    . vitamin C (ASCORBIC ACID) 250 MG tablet Take 250 mg by mouth daily.    . Vitamin D, Ergocalciferol, (DRISDOL) 1.25 MG (50000 UT) CAPS capsule Take 1 capsule (50,000 Units total) by mouth every 7 (seven) days. 4 capsule 0   No current facility-administered medications on  file prior to visit.     PAST MEDICAL HISTORY: Past Medical History:  Diagnosis Date  . Back pain   . Constipation   . Depression   . Fibromyalgia   . Gallbladder problem   . GERD (gastroesophageal reflux disease)   . Hyperlipidemia   . Hypertension   . Joint pain   . Multiple sclerosis (HCC)   . Osteopenia   . Swelling   . Vitamin D deficiency     PAST SURGICAL HISTORY: Past Surgical History:  Procedure Laterality Date  . ABDOMINAL HYSTERECTOMY    . ABDOMINOPLASTY    . BREAST REDUCTION SURGERY    . CHOLECYSTECTOMY    . TMJ ARTHROPLASTY      SOCIAL HISTORY: Social History   Tobacco Use  . Smoking status: Never Smoker  . Smokeless tobacco: Never Used  Substance Use Topics  . Alcohol use: Yes    Comment: social  . Drug use: Never    FAMILY HISTORY: Family History  Problem Relation Age of Onset  . Diabetes Mother   . Hypertension Mother   . Stroke Father   . Hypertension Father     ROS: Review of Systems  Constitutional: Positive for malaise/fatigue. Negative for weight loss.  Cardiovascular: Negative for chest pain and claudication.  Gastrointestinal: Negative for nausea and vomiting.  Musculoskeletal: Negative for myalgias.       Negative muscle weakness    PHYSICAL EXAM: Pt in no acute distress  RECENT LABS AND TESTS: BMET    Component Value Date/Time   NA 137 09/04/2018 0901   K 4.3 09/04/2018 0901   CL 103 09/04/2018 0901   CO2 18 (L) 09/04/2018 0901   GLUCOSE 88 09/04/2018 0901   BUN 7 09/04/2018 0901   CREATININE 0.78 09/04/2018 0901   CALCIUM 9.5 09/04/2018 0901   GFRNONAA 93 09/04/2018 0901   GFRAA 108 09/04/2018 0901   Lab Results  Component Value Date   HGBA1C 5.0 09/04/2018   Lab Results  Component Value Date   INSULIN 13.6 09/04/2018   CBC    Component Value Date/Time   WBC 5.8 09/04/2018 0832   RBC 4.55 09/04/2018 0832   HGB 13.3 09/04/2018 0832   HCT 40.6 09/04/2018 0832   MCV 89 09/04/2018 0832   MCH 29.2  09/04/2018 0832   MCHC 32.8 09/04/2018 0832   RDW 13.6 09/04/2018 0832   LYMPHSABS 2.0 09/04/2018 0832   EOSABS 0.1 09/04/2018 0832   BASOSABS 0.0 09/04/2018 0832   Iron/TIBC/Ferritin/ %Sat No results found for: IRON, TIBC, FERRITIN, IRONPCTSAT Lipid Panel     Component Value Date/Time   CHOL 184 09/04/2018 0832   TRIG 81 09/04/2018 0832   HDL 54 09/04/2018 0832   LDLCALC 114 (H) 09/04/2018 0832   Hepatic Function Panel     Component Value Date/Time   PROT 7.0 09/04/2018 0901   ALBUMIN 4.1 09/04/2018 0901   AST 14 09/04/2018 0901   ALT 12 09/04/2018 0901   ALKPHOS 89 09/04/2018 0901   BILITOT 0.4 09/04/2018 0901      Component Value Date/Time   TSH 1.810 09/04/2018 0901      I, Burt Knack, am acting as transcriptionist for Debbra Riding, MD  I have reviewed the above documentation for accuracy and completeness, and I agree with the above. - Debbra Riding, MD

## 2018-12-05 ENCOUNTER — Other Ambulatory Visit (INDEPENDENT_AMBULATORY_CARE_PROVIDER_SITE_OTHER): Payer: Self-pay | Admitting: Family Medicine

## 2018-12-05 DIAGNOSIS — E7849 Other hyperlipidemia: Secondary | ICD-10-CM | POA: Diagnosis not present

## 2018-12-05 DIAGNOSIS — E559 Vitamin D deficiency, unspecified: Secondary | ICD-10-CM | POA: Diagnosis not present

## 2018-12-05 DIAGNOSIS — E789 Disorder of lipoprotein metabolism, unspecified: Secondary | ICD-10-CM | POA: Diagnosis not present

## 2018-12-05 DIAGNOSIS — E8881 Metabolic syndrome: Secondary | ICD-10-CM | POA: Diagnosis not present

## 2018-12-06 ENCOUNTER — Encounter (INDEPENDENT_AMBULATORY_CARE_PROVIDER_SITE_OTHER): Payer: Self-pay | Admitting: Family Medicine

## 2018-12-06 LAB — LIPID PANEL WITH LDL/HDL RATIO
Cholesterol, Total: 215 mg/dL — ABNORMAL HIGH (ref 100–199)
HDL: 56 mg/dL (ref >39)
LDL Calculated: 145 mg/dL — ABNORMAL HIGH (ref 0–99)
LDl/HDL Ratio: 2.6 ratio (ref 0.0–3.2)
Triglycerides: 68 mg/dL (ref 0–149)
VLDL Cholesterol Cal: 14 mg/dL (ref 5–40)

## 2018-12-06 LAB — HEMOGLOBIN A1C
Est. average glucose Bld gHb Est-mCnc: 88 mg/dL
Hgb A1c MFr Bld: 4.7 % — ABNORMAL LOW (ref 4.8–5.6)

## 2018-12-06 LAB — INSULIN, RANDOM: INSULIN: 21.7 u[IU]/mL (ref 2.6–24.9)

## 2018-12-06 LAB — VITAMIN D 25 HYDROXY (VIT D DEFICIENCY, FRACTURES): Vit D, 25-Hydroxy: 51.5 ng/mL (ref 30.0–100.0)

## 2018-12-07 ENCOUNTER — Other Ambulatory Visit: Payer: Self-pay | Admitting: Neurology

## 2018-12-09 NOTE — Telephone Encounter (Signed)
Please advise 

## 2018-12-11 ENCOUNTER — Other Ambulatory Visit (INDEPENDENT_AMBULATORY_CARE_PROVIDER_SITE_OTHER): Payer: Self-pay | Admitting: Family Medicine

## 2018-12-11 DIAGNOSIS — E559 Vitamin D deficiency, unspecified: Secondary | ICD-10-CM

## 2018-12-11 NOTE — Telephone Encounter (Signed)
Please advise 

## 2018-12-18 ENCOUNTER — Other Ambulatory Visit (INDEPENDENT_AMBULATORY_CARE_PROVIDER_SITE_OTHER): Payer: Self-pay | Admitting: Family Medicine

## 2018-12-18 DIAGNOSIS — E559 Vitamin D deficiency, unspecified: Secondary | ICD-10-CM

## 2018-12-19 ENCOUNTER — Ambulatory Visit (INDEPENDENT_AMBULATORY_CARE_PROVIDER_SITE_OTHER): Payer: Medicare Other | Admitting: Family Medicine

## 2018-12-19 ENCOUNTER — Other Ambulatory Visit: Payer: Self-pay

## 2018-12-19 DIAGNOSIS — Z6833 Body mass index (BMI) 33.0-33.9, adult: Secondary | ICD-10-CM

## 2018-12-19 DIAGNOSIS — E559 Vitamin D deficiency, unspecified: Secondary | ICD-10-CM | POA: Diagnosis not present

## 2018-12-19 DIAGNOSIS — E8881 Metabolic syndrome: Secondary | ICD-10-CM

## 2018-12-19 DIAGNOSIS — E669 Obesity, unspecified: Secondary | ICD-10-CM

## 2018-12-19 MED ORDER — LIRAGLUTIDE 18 MG/3ML ~~LOC~~ SOPN: 0.6000 mg | PEN_INJECTOR | SUBCUTANEOUS | 0 refills | Status: DC

## 2018-12-23 ENCOUNTER — Encounter (INDEPENDENT_AMBULATORY_CARE_PROVIDER_SITE_OTHER): Payer: Self-pay | Admitting: Family Medicine

## 2018-12-23 NOTE — Progress Notes (Signed)
Office: [phone]  /  Fax: [phone] TeleHealth Visit:  Erica Henson has verbally consented to this TeleHealth visit today. The patient is located at home, the provider is located at the [facility] office. The participants in this visit include the listed provider and patient. The visit was conducted today via webex.  HPI:   Chief Complaint: OBESITY Erica Henson is here to discuss her progress with her obesity treatment plan. She is on the keep a food journal with 1250-1350 calories and 85+ grams of protein daily and is following her eating plan approximately 0 % of the time (unsure). She states she is doing core yoga and aerobics for 45 minutes 3 times per week. Erica Henson is weighing at home. Her weight today is of 197 lbs. She finds journaling to be possibly not giving her the results she wants weight wise. She is getting most of her protein from meat.  We were unable to weigh the patient today for this TeleHealth visit. She feels as if she has gained 4-5 lbs since her last visit. She has lost 1 lb since starting treatment with [place].  Insulin Resistance Erica Henson has a diagnosis of insulin resistance based on her elevated fasting insulin level >5. Although Erica Henson's blood glucose readings are still under good control, insulin resistance puts her at greater risk of metabolic syndrome and diabetes. She notes carbohydrate cravings. Her recent labs show an increase in insulin level even on metformin. She continues to work on diet and exercise to decrease risk of diabetes.  Vitamin D Deficiency Erica Henson has a diagnosis of vitamin D deficiency. She is currently taking prescription Vit D. She notes fatigue and denies nausea, vomiting or muscle weakness.  ASSESSMENT AND PLAN:  Insulin resistance - Plan: liraglutide (VICTOZA) 18 MG/3ML SOPN  Vitamin D deficiency  Class 1 obesity with serious comorbidity and body mass index (BMI) of 33.0 to 33.9 in adult, unspecified obesity  type  PLAN:  Insulin Resistance Erica Henson will continue to work on weight loss, exercise, and decreasing simple carbohydrates in her diet to help decrease the risk of diabetes. We dicussed metformin including benefits and risks. She was informed that eating too many simple carbohydrates or too many calories at one sitting increases the likelihood of GI side effects. Malissie agrees to stop metformin, and she agrees to start Victoza 0.6 mg SubQ daily #1 pen with no refills. Erica Henson agrees to follow up with our clinic in 3 weeks as directed to monitor her progress.  Vitamin D Deficiency Erica Henson was informed that low vitamin D levels contributes to fatigue and are associated with obesity, breast, and colon cancer. Erica Henson agrees to continue taking prescription Vit D ,000 IU every week, no refill needed. She will follow up for routine testing of vitamin D, at least 2-3 times per year. She was informed of the risk of over-replacement of vitamin D and agrees to not increase her dose unless she discusses this with [place] first. Erica Henson agrees to follow up with our clinic in 3 weeks.  Obesity Erica Henson is currently in the action stage of change. As such, her goal is to continue with weight loss efforts She has agreed to keep a food journal with 1250-1350 calories and 85+ grams of protein daily Erica Henson has been instructed to work up to a goal of 150 minutes of combined cardio and strengthening exercise per week for weight loss and overall health benefits. We discussed the following Behavioral Modification Strategies today: increasing lean protein intake, increasing vegetables and work  on meal planning and easy cooking plans, keeping healthy foods in the home, planning for success, and keep a strict food journal   Erica Henson has agreed to follow up with our clinic in 3 weeks. She was informed of the importance of frequent follow up visits to maximize her success with intensive lifestyle modifications for her  multiple health conditions.  ALLERGIES: Allergies  Allergen Reactions   Codeine Itching   Lunesta [Eszopiclone] Nausea And Vomiting    MEDICATIONS: Current Outpatient Medications on File Prior to Visit  Medication Sig Dispense Refill   amphetamine-dextroamphetamine (ADDERALL) 10 MG tablet Take 10 mg by mouth daily with breakfast.     b complex vitamins tablet Take 1 tablet by mouth daily.     metFORMIN (GLUCOPHAGE) 500 MG tablet Take 1 tablet (500 mg total) by mouth daily with breakfast. 30 tablet 0   omeprazole (PRILOSEC) 20 MG capsule Take 20 mg by mouth daily.     Probiotic Product (PROBIOTIC-10 PO) Take 1 tablet by mouth daily.     vitamin C (ASCORBIC ACID) 250 MG tablet Take 250 mg by mouth daily.     Vitamin D, Ergocalciferol, (DRISDOL) 1.25 MG (50000 UT) CAPS capsule Take 1 capsule (50,000 Units total) by mouth every 7 (seven) days. 4 capsule 0   No current facility-administered medications on file prior to visit.     PAST MEDICAL HISTORY: Past Medical History:  Diagnosis Date   Back pain    Constipation    Depression    Fibromyalgia    Gallbladder problem    GERD (gastroesophageal reflux disease)    Hyperlipidemia    Hypertension    Joint pain    Multiple sclerosis (HCC)    Osteopenia    Swelling    Vitamin D deficiency     PAST SURGICAL HISTORY: Past Surgical History:  Procedure Laterality Date   ABDOMINAL HYSTERECTOMY     ABDOMINOPLASTY     BREAST REDUCTION SURGERY     CHOLECYSTECTOMY     TMJ ARTHROPLASTY      SOCIAL HISTORY: Social History   Tobacco Use   Smoking status: Never Smoker   Smokeless tobacco: Never Used  Substance Use Topics   Alcohol use: Yes    Comment: social   Drug use: Never    FAMILY HISTORY: Family History  Problem Relation Age of Onset   Diabetes Mother    Hypertension Mother    Stroke Father    Hypertension Father     ROS: Review of Systems  Constitutional: Positive for  malaise/fatigue. Negative for weight loss.  Gastrointestinal: Negative for nausea and vomiting.  Musculoskeletal:       Negative muscle weakness    PHYSICAL EXAM: Pt in no acute distress  RECENT LABS AND TESTS: BMET    Component Value Date/Time   NA 137 09/04/2018 0901   K 4.3 09/04/2018 0901   CL 103 09/04/2018 0901   CO2 18 (L) 09/04/2018 0901   GLUCOSE 88 09/04/2018 0901   BUN 7 09/04/2018 0901   CREATININE 0.78 09/04/2018 0901   CALCIUM 9.5 09/04/2018 0901   GFRNONAA 93 09/04/2018 0901   GFRAA 108 09/04/2018 0901   Lab Results  Component Value Date   HGBA1C 4.7 (L) 12/05/2018   HGBA1C 5.0 09/04/2018   Lab Results  Component Value Date   INSULIN 21.7 12/05/2018   INSULIN 13.6 09/04/2018   CBC    Component Value Date/Time   WBC 5.8 09/04/2018 0832   RBC 4.55 09/04/2018 0832  HGB 13.3 09/04/2018 0832   HCT 40.6 09/04/2018 0832   MCV 89 09/04/2018 0832   MCH 29.2 09/04/2018 0832   MCHC 32.8 09/04/2018 0832   RDW 13.6 09/04/2018 0832   LYMPHSABS 2.0 09/04/2018 0832   EOSABS 0.1 09/04/2018 0832   BASOSABS 0.0 09/04/2018 0832   Iron/TIBC/Ferritin/ %Sat No results found for: IRON, TIBC, FERRITIN, IRONPCTSAT Lipid Panel     Component Value Date/Time   CHOL 215 (H) 12/05/2018 1112   TRIG 68 12/05/2018 1112   HDL 56 12/05/2018 1112   LDLCALC 145 (H) 12/05/2018 1112   Hepatic Function Panel     Component Value Date/Time   PROT 7.0 09/04/2018 0901   ALBUMIN 4.1 09/04/2018 0901   AST 14 09/04/2018 0901   ALT 12 09/04/2018 0901   ALKPHOS 89 09/04/2018 0901   BILITOT 0.4 09/04/2018 0901      Component Value Date/Time   TSH 1.810 09/04/2018 0901      I, Burt Knack, am acting as transcriptionist for Debbra Riding, MD  I have reviewed the above documentation for accuracy and completeness, and I agree with the above. - Debbra Riding, MD

## 2018-12-24 NOTE — Telephone Encounter (Signed)
Please review

## 2018-12-26 NOTE — Telephone Encounter (Signed)
Please advise 

## 2019-01-06 DIAGNOSIS — B373 Candidiasis of vulva and vagina: Secondary | ICD-10-CM | POA: Diagnosis not present

## 2019-01-07 DIAGNOSIS — M722 Plantar fascial fibromatosis: Secondary | ICD-10-CM | POA: Diagnosis not present

## 2019-01-07 DIAGNOSIS — M25872 Other specified joint disorders, left ankle and foot: Secondary | ICD-10-CM | POA: Diagnosis not present

## 2019-01-08 ENCOUNTER — Encounter (INDEPENDENT_AMBULATORY_CARE_PROVIDER_SITE_OTHER): Payer: Self-pay | Admitting: Family Medicine

## 2019-01-08 ENCOUNTER — Other Ambulatory Visit: Payer: Self-pay

## 2019-01-08 ENCOUNTER — Ambulatory Visit (INDEPENDENT_AMBULATORY_CARE_PROVIDER_SITE_OTHER): Payer: Medicare Other | Admitting: Family Medicine

## 2019-01-08 VITALS — BP 116/78 | HR 67 | Temp 98.4°F | Ht 65.0 in | Wt 191.0 lb

## 2019-01-08 DIAGNOSIS — E559 Vitamin D deficiency, unspecified: Secondary | ICD-10-CM | POA: Diagnosis not present

## 2019-01-08 DIAGNOSIS — Z9189 Other specified personal risk factors, not elsewhere classified: Secondary | ICD-10-CM | POA: Diagnosis not present

## 2019-01-08 DIAGNOSIS — E8881 Metabolic syndrome: Secondary | ICD-10-CM | POA: Diagnosis not present

## 2019-01-08 DIAGNOSIS — E669 Obesity, unspecified: Secondary | ICD-10-CM

## 2019-01-08 DIAGNOSIS — Z6831 Body mass index (BMI) 31.0-31.9, adult: Secondary | ICD-10-CM | POA: Diagnosis not present

## 2019-01-08 MED ORDER — VITAMIN D (ERGOCALCIFEROL) 1.25 MG (50000 UNIT) PO CAPS: 50000.0000 [IU] | ORAL_CAPSULE | ORAL | 0 refills | Status: DC

## 2019-01-09 ENCOUNTER — Other Ambulatory Visit (INDEPENDENT_AMBULATORY_CARE_PROVIDER_SITE_OTHER): Payer: Self-pay | Admitting: Family Medicine

## 2019-01-09 DIAGNOSIS — E8881 Metabolic syndrome: Secondary | ICD-10-CM

## 2019-01-09 NOTE — Progress Notes (Signed)
Office: [phone]  /  Fax: [phone]   HPI:   Chief Complaint: OBESITY Erica Henson is here to discuss her progress with her obesity treatment plan. She is on the keep a food journal with 1250-1350 calories and 85+ grams of protein daily and is following her eating plan approximately 98 % of the time. She states she is walking and jogging for 45-60 minutes 3 times per week. Erica Henson has had more energy in the past few weeks, keeping a dry erase board near her bed to mark off what she is eating. She is doing smoothies in the morning, incorporating rice into her meal plan. She is not feeling hungry. She is doing intermittent fasting with a maximum of 12 hours fasting.  Her weight is 191 lb (86.6 kg) today and has had a weight loss of 8 pounds over a period of 3 weeks since her last visit. She has lost 9 lbs since starting treatment with us.  Insulin Resistance Erica Henson has a diagnosis of insulin resistance based on her elevated fasting insulin level >5. Although Erica Henson's blood glucose readings are still under good control, insulin resistance puts her at greater risk of metabolic syndrome and diabetes. She started Victoza and denies GI side effects. She does feel she is feeling fuller longer. She continues to work on diet and exercise to decrease risk of diabetes.  Vitamin D Deficiency Erica Henson has a diagnosis of vitamin D deficiency. She is currently taking prescription Vit D. She notes fatigue and denies nausea, vomiting or muscle weakness.  At risk for osteopenia and osteoporosis Erica Henson is at higher risk of osteopenia and osteoporosis due to vitamin D deficiency.   ASSESSMENT AND PLAN:  Insulin resistance  Vitamin D deficiency - Plan: Vitamin D, Ergocalciferol, (DRISDOL) 1.25 MG (50000 UT) CAPS capsule  At risk for osteoporosis  Class 1 obesity with serious comorbidity and body mass index (BMI) of 31.0 to 31.9 in adult, unspecified obesity type  PLAN:  Insulin Resistance  Erica Henson will continue to work on weight loss, exercise, and decreasing simple carbohydrates in her diet to help decrease the risk of diabetes. We dicussed metformin including benefits and risks. She was informed that eating too many simple carbohydrates or too many calories at one sitting increases the likelihood of GI side effects. Erica Henson agrees to continue Victoza, and she agrees to follow up with our clinic in 2 weeks as directed to monitor her progress.  Vitamin D Deficiency Erica Henson was informed that low vitamin D levels contributes to fatigue and are associated with obesity, breast, and colon cancer. Erica Henson agrees to change prescription Vit D to 50,000 IU every 14 days #4 with no refills. She will follow up for routine testing of vitamin D, at least 2-3 times per year. She was informed of the risk of over-replacement of vitamin D and agrees to not increase her dose unless she discusses this with us first. Erica Henson agrees to follow up with our clinic in 2 weeks.  At risk for osteopenia and osteoporosis Erica Henson was given extended (15 minutes) osteoporosis prevention counseling today. Erica Henson is at risk for osteopenia and osteoporsis due to her vitamin D deficiency. She was encouraged to take her vitamin D and follow her higher calcium diet and increase strengthening exercise to help strengthen her bones and decrease her risk of osteopenia and osteoporosis.  Obesity Erica Henson is currently in the action stage of change. As such, her goal is to continue with weight loss efforts She has agreed to keep a food journal  with 1250-1300 calories and 90+ grams of protein daily Erica Henson has been instructed to work up to a goal of 150 minutes of combined cardio and strengthening exercise per week for weight loss and overall health benefits. We discussed the following Behavioral Modification Strategies today: increasing lean protein intake   Erica Henson has agreed to follow up with our clinic in 2 weeks.  She was informed of the importance of frequent follow up visits to maximize her success with intensive lifestyle modifications for her multiple health conditions.  ALLERGIES: Allergies  Allergen Reactions  . Codeine Itching  . Lunesta [Eszopiclone] Nausea And Vomiting    MEDICATIONS: Current Outpatient Medications on File Prior to Visit  Medication Sig Dispense Refill  . amphetamine-dextroamphetamine (ADDERALL) 10 MG tablet Take 10 mg by mouth daily with breakfast.    . b complex vitamins tablet Take 1 tablet by mouth daily.    Marland Kitchen liraglutide (VICTOZA) 18 MG/3ML SOPN Inject 0.1 mLs (0.6 mg total) into the skin every morning. 1 pen 0  . omeprazole (PRILOSEC) 20 MG capsule Take 20 mg by mouth daily.    . Probiotic Product (PROBIOTIC-10 PO) Take 1 tablet by mouth daily.    . vitamin C (ASCORBIC ACID) 250 MG tablet Take 250 mg by mouth daily.     No current facility-administered medications on file prior to visit.     PAST MEDICAL HISTORY: Past Medical History:  Diagnosis Date  . Back pain   . Constipation   . Depression   . Fibromyalgia   . Gallbladder problem   . GERD (gastroesophageal reflux disease)   . Hyperlipidemia   . Hypertension   . Joint pain   . Multiple sclerosis ([place])   . Osteopenia   . Swelling   . Vitamin D deficiency     PAST SURGICAL HISTORY: Past Surgical History:  Procedure Laterality Date  . ABDOMINAL HYSTERECTOMY    . ABDOMINOPLASTY    . BREAST REDUCTION SURGERY    . CHOLECYSTECTOMY    . TMJ ARTHROPLASTY      SOCIAL HISTORY: Social History   Tobacco Use  . Smoking status: Never Smoker  . Smokeless tobacco: Never Used  Substance Use Topics  . Alcohol use: Yes    Comment: social  . Drug use: Never    FAMILY HISTORY: Family History  Problem Relation Age of Onset  . Diabetes Mother   . Hypertension Mother   . Stroke Father   . Hypertension Father     ROS: Review of Systems  Constitutional: Positive for malaise/fatigue and weight  loss.  Gastrointestinal: Negative for nausea and vomiting.  Musculoskeletal:       Negative muscle weakness    PHYSICAL EXAM: Blood pressure 116/78, pulse 67, temperature 98.4 F (36.9 C), temperature source Oral, height 5\' 5"  (1.651 m), weight 191 lb (86.6 kg), SpO2 100 %. Body mass index is 31.78 kg/m. Physical Exam Vitals signs reviewed.  Constitutional:      Appearance: Normal appearance. She is obese.  Cardiovascular:     Rate and Rhythm: Normal rate.     Pulses: Normal pulses.  Pulmonary:     Effort: Pulmonary effort is normal.     Breath sounds: Normal breath sounds.  Musculoskeletal: Normal range of motion.  Skin:    General: Skin is warm and dry.  Neurological:     Mental Status: She is alert and oriented to person, place, and time.  Psychiatric:        Mood and Affect: Mood normal.  Behavior: Behavior normal.     RECENT LABS AND TESTS: BMET    Component Value Date/Time   NA 137 09/04/2018 0901   K 4.3 09/04/2018 0901   CL 103 09/04/2018 0901   CO2 18 (L) 09/04/2018 0901   GLUCOSE 88 09/04/2018 0901   BUN 7 09/04/2018 0901   CREATININE 0.78 09/04/2018 0901   CALCIUM 9.5 09/04/2018 0901   GFRNONAA 93 09/04/2018 0901   GFRAA 108 09/04/2018 0901   Lab Results  Component Value Date   HGBA1C 4.7 (L) 12/05/2018   HGBA1C 5.0 09/04/2018   Lab Results  Component Value Date   INSULIN 21.7 12/05/2018   INSULIN 13.6 09/04/2018   CBC    Component Value Date/Time   WBC 5.8 09/04/2018 0832   RBC 4.55 09/04/2018 0832   HGB 13.3 09/04/2018 0832   HCT 40.6 09/04/2018 0832   MCV 89 09/04/2018 0832   MCH 29.2 09/04/2018 0832   MCHC 32.8 09/04/2018 0832   RDW 13.6 09/04/2018 0832   LYMPHSABS 2.0 09/04/2018 0832   EOSABS 0.1 09/04/2018 0832   BASOSABS 0.0 09/04/2018 0832   Iron/TIBC/Ferritin/ %Sat No results found for: IRON, TIBC, FERRITIN, IRONPCTSAT Lipid Panel     Component Value Date/Time   CHOL 215 (H) 12/05/2018 1112   TRIG 68 12/05/2018  1112   HDL 56 12/05/2018 1112   LDLCALC 145 (H) 12/05/2018 1112   Hepatic Function Panel     Component Value Date/Time   PROT 7.0 09/04/2018 0901   ALBUMIN 4.1 09/04/2018 0901   AST 14 09/04/2018 0901   ALT 12 09/04/2018 0901   ALKPHOS 89 09/04/2018 0901   BILITOT 0.4 09/04/2018 0901      Component Value Date/Time   TSH 1.810 09/04/2018 0901      OBESITY BEHAVIORAL INTERVENTION VISIT  Today's visit was # 9   Starting weight: 200 lbs Starting date: 09/03/2018 Today's weight : 191 lbs  Today's date: 01/08/2019 Total lbs lost to date: 9    ASK: We discussed the diagnosis of obesity with Erica Henson today and Erica Sell agreed to give [place] permission to discuss obesity behavioral modification therapy today.  ASSESS: Erica Henson has the diagnosis of obesity and her BMI today is 31.78 Erica Henson is in the action stage of change   ADVISE: Erica Henson was educated on the multiple health risks of obesity as well as the benefit of weight loss to improve her health. She was advised of the need for long term treatment and the importance of lifestyle modifications to improve her current health and to decrease her risk of future health problems.  AGREE: Multiple dietary modification options and treatment options were discussed and  Erica Henson agreed to follow the recommendations documented in the above note.  ARRANGE: Erica Henson was educated on the importance of frequent visits to treat obesity as outlined per CMS and USPSTF guidelines and agreed to schedule her next follow up appointment today.  I, Burt Knack, am acting as transcriptionist for Debbra Riding, MD  I have reviewed the above documentation for accuracy and completeness, and I agree with the above. - Debbra Riding, MD

## 2019-01-11 ENCOUNTER — Encounter (INDEPENDENT_AMBULATORY_CARE_PROVIDER_SITE_OTHER): Payer: Self-pay | Admitting: Family Medicine

## 2019-01-13 ENCOUNTER — Other Ambulatory Visit (INDEPENDENT_AMBULATORY_CARE_PROVIDER_SITE_OTHER): Payer: Self-pay

## 2019-01-13 ENCOUNTER — Encounter (INDEPENDENT_AMBULATORY_CARE_PROVIDER_SITE_OTHER): Payer: Self-pay | Admitting: Family Medicine

## 2019-01-13 DIAGNOSIS — R17 Unspecified jaundice: Secondary | ICD-10-CM

## 2019-01-13 DIAGNOSIS — E8881 Metabolic syndrome: Secondary | ICD-10-CM

## 2019-01-13 MED ORDER — VICTOZA 18 MG/3ML ~~LOC~~ SOPN: 0.6000 mg | PEN_INJECTOR | SUBCUTANEOUS | 0 refills | Status: DC

## 2019-01-13 NOTE — Telephone Encounter (Signed)
Please advise 

## 2019-01-14 NOTE — Telephone Encounter (Signed)
Please advise 

## 2019-01-14 NOTE — Telephone Encounter (Signed)
Patient instructed to go to [facility] to have labs drawn.

## 2019-01-17 DIAGNOSIS — R17 Unspecified jaundice: Secondary | ICD-10-CM | POA: Diagnosis not present

## 2019-01-18 LAB — COMPREHENSIVE METABOLIC PANEL
ALT: 15 IU/L (ref 0–32)
AST: 16 IU/L (ref 0–40)
Albumin/Globulin Ratio: 1.4 (ref 1.2–2.2)
Albumin: 4.1 g/dL (ref 3.8–4.8)
Alkaline Phosphatase: 107 IU/L (ref 39–117)
BUN/Creatinine Ratio: 11 (ref 9–23)
BUN: 9 mg/dL (ref 6–24)
Bilirubin Total: 0.3 mg/dL (ref 0.0–1.2)
CO2: 22 mmol/L (ref 20–29)
Calcium: 9.3 mg/dL (ref 8.7–10.2)
Chloride: 106 mmol/L (ref 96–106)
Creatinine, Ser: 0.81 mg/dL (ref 0.57–1.00)
GFR calc Af Amer: 103 mL/min/{1.73_m2} (ref >59)
GFR calc non Af Amer: 89 mL/min/{1.73_m2} (ref >59)
Globulin, Total: 2.9 g/dL (ref 1.5–4.5)
Glucose: 95 mg/dL (ref 65–99)
Potassium: 4.3 mmol/L (ref 3.5–5.2)
Sodium: 141 mmol/L (ref 134–144)
Total Protein: 7 g/dL (ref 6.0–8.5)

## 2019-01-22 ENCOUNTER — Ambulatory Visit (INDEPENDENT_AMBULATORY_CARE_PROVIDER_SITE_OTHER): Payer: Medicare Other | Admitting: Family Medicine

## 2019-01-22 ENCOUNTER — Other Ambulatory Visit: Payer: Self-pay

## 2019-01-22 ENCOUNTER — Encounter (INDEPENDENT_AMBULATORY_CARE_PROVIDER_SITE_OTHER): Payer: Self-pay | Admitting: Family Medicine

## 2019-01-22 VITALS — BP 135/83 | HR 74 | Temp 98.1°F | Ht 65.0 in | Wt 194.0 lb

## 2019-01-22 DIAGNOSIS — E8881 Metabolic syndrome: Secondary | ICD-10-CM | POA: Diagnosis not present

## 2019-01-22 DIAGNOSIS — Z9189 Other specified personal risk factors, not elsewhere classified: Secondary | ICD-10-CM

## 2019-01-22 DIAGNOSIS — Z6832 Body mass index (BMI) 32.0-32.9, adult: Secondary | ICD-10-CM

## 2019-01-22 DIAGNOSIS — E669 Obesity, unspecified: Secondary | ICD-10-CM

## 2019-01-22 DIAGNOSIS — E7849 Other hyperlipidemia: Secondary | ICD-10-CM

## 2019-01-23 ENCOUNTER — Encounter (INDEPENDENT_AMBULATORY_CARE_PROVIDER_SITE_OTHER): Payer: Self-pay | Admitting: Family Medicine

## 2019-01-23 MED ORDER — LIRAGLUTIDE 18 MG/3ML ~~LOC~~ SOPN: 1.2000 mg | PEN_INJECTOR | Freq: Every morning | SUBCUTANEOUS | 0 refills | Status: DC

## 2019-01-23 NOTE — Progress Notes (Signed)
Office: [phone]  /  Fax: [phone]   HPI:   Chief Complaint: OBESITY Erica Henson is here to discuss her progress with her obesity treatment plan. She is on the keep a food journal with 1250-1300 calories and 90+ grams of protein daily and is following her eating plan approximately 100 % of the time. She states she is doing zumba and power walking for 45-60 minutes 3-4 times per week. Erica Henson had some symptoms the last few weeks, noticed yellowing of the eyes, dry mouth, dry eyes, and vaginal dryness. She stopped Victoza. She is journaling and still doing intermittent fasting.  Her weight is 194 lb (88 kg) today and has gained 3 lbs since her last visit. She has lost 6 lbs since starting treatment with us.  Insulin Resistance Erica Henson has a diagnosis of insulin resistance based on her elevated fasting insulin level >5. Last Hgb A1c was of 4.7 and insulin of 21.7. Although Erica Henson's blood glucose readings are still under good control, insulin resistance puts her at greater risk of metabolic syndrome and diabetes. She notes minimal carbohydrate cravings and continues to work on diet and exercise to decrease risk of diabetes.  Hyperlipidemia Erica Henson has hyperlipidemia and has been trying to improve her cholesterol levels with intensive lifestyle modification including a low saturated fat diet, exercise and weight loss. Last LDL was of 145 and HDL of 56. She is not on statin and denies any chest pain, claudication or myalgias.  At risk for cardiovascular disease Erica Henson is at a higher than average risk for cardiovascular disease due to obesity and hyperlipidemia. She currently denies any chest pain.  ASSESSMENT AND PLAN:  Insulin resistance  Other hyperlipidemia  At risk for heart disease  Class 1 obesity with serious comorbidity and body mass index (BMI) of 32.0 to 32.9 in adult, unspecified obesity type  PLAN:  Insulin Resistance Erica Henson will continue to work on weight  loss, exercise, and decreasing simple carbohydrates in her diet to help decrease the risk of diabetes. We dicussed metformin including benefits and risks. She was informed that eating too many simple carbohydrates or too many calories at one sitting increases the likelihood of GI side effects. Erica Henson agrees to continue Victoza 1.2 mg SubQ q AM #2 pens and we will refill for 1 month. Erica Henson agrees to follow up with our clinic in 2 weeks as directed to monitor her progress.  Hyperlipidemia Erica Henson was informed of the American Heart Association Guidelines emphasizing intensive lifestyle modifications as the first line treatment for hyperlipidemia. We discussed many lifestyle modifications today in depth, and Erica Henson will continue to work on decreasing saturated fats such as fatty red meat, butter and many fried foods. She will also increase vegetables and lean protein in her diet and continue to work on exercise and weight loss efforts. We will repeat FLP in mid or late August. Erica Henson agrees to follow up with our clinic in 2 weeks.  Cardiovascular risk counseling Erica Henson was given extended (15 minutes) coronary artery disease prevention counseling today. She is 43 y.o. female and has risk factors for heart disease including obesity and hyperlipidemia. We discussed intensive lifestyle modifications today with an emphasis on specific weight loss instructions and strategies. Pt was also informed of the importance of increasing exercise and decreasing saturated fats to help prevent heart disease.  Obesity Erica Henson is currently in the action stage of change. As such, her goal is to continue with weight loss efforts She has agreed to keep a food journal with 1200-1300  calories and 90+ grams of protein daily Erica Henson has been instructed to work up to a goal of 150 minutes of combined cardio and strengthening exercise per week for weight loss and overall health benefits. We discussed the following  Behavioral Modification Strategies today: increasing lean protein intake, increasing vegetables and work on meal planning and easy cooking plans, no skipping meals and planning for success   Erica Henson has agreed to follow up with our clinic in 2 weeks. She was informed of the importance of frequent follow up visits to maximize her success with intensive lifestyle modifications for her multiple health conditions.  ALLERGIES: Allergies  Allergen Reactions  . Codeine Itching  . Lunesta [Eszopiclone] Nausea And Vomiting    MEDICATIONS: Current Outpatient Medications on File Prior to Visit  Medication Sig Dispense Refill  . amphetamine-dextroamphetamine (ADDERALL) 10 MG tablet Take 10 mg by mouth daily with breakfast.    . b complex vitamins tablet Take 1 tablet by mouth daily.    Marland Kitchen omeprazole (PRILOSEC) 20 MG capsule Take 20 mg by mouth daily.    . Probiotic Product (PROBIOTIC-10 PO) Take 1 tablet by mouth daily.    . vitamin C (ASCORBIC ACID) 250 MG tablet Take 250 mg by mouth daily.    . Vitamin D, Ergocalciferol, (DRISDOL) 1.25 MG (50000 UT) CAPS capsule Take 1 capsule (50,000 Units total) by mouth every 14 (fourteen) days. 4 capsule 0  . liraglutide (VICTOZA) 18 MG/3ML SOPN Inject 0.1 mLs (0.6 mg total) into the skin every morning. (Patient not taking: Reported on 01/22/2019) 1 pen 0   No current facility-administered medications on file prior to visit.     PAST MEDICAL HISTORY: Past Medical History:  Diagnosis Date  . Back pain   . Constipation   . Depression   . Fibromyalgia   . Gallbladder problem   . GERD (gastroesophageal reflux disease)   . Hyperlipidemia   . Hypertension   . Joint pain   . Multiple sclerosis ([place])   . Osteopenia   . Swelling   . Vitamin D deficiency     PAST SURGICAL HISTORY: Past Surgical History:  Procedure Laterality Date  . ABDOMINAL HYSTERECTOMY    . ABDOMINOPLASTY    . BREAST REDUCTION SURGERY    . CHOLECYSTECTOMY    . TMJ ARTHROPLASTY       SOCIAL HISTORY: Social History   Tobacco Use  . Smoking status: Never Smoker  . Smokeless tobacco: Never Used  Substance Use Topics  . Alcohol use: Yes    Comment: social  . Drug use: Never    FAMILY HISTORY: Family History  Problem Relation Age of Onset  . Diabetes Mother   . Hypertension Mother   . Stroke Father   . Hypertension Father     ROS: Review of Systems  Constitutional: Negative for weight loss.  Cardiovascular: Negative for chest pain and claudication.  Musculoskeletal: Negative for myalgias.    PHYSICAL EXAM: Blood pressure 135/83, pulse 74, temperature 98.1 F (36.7 C), height 5\' 5"  (1.651 m), weight 194 lb (88 kg), SpO2 100 %. Body mass index is 32.28 kg/m. Physical Exam Vitals signs reviewed.  Constitutional:      Appearance: Normal appearance. She is obese.  Cardiovascular:     Rate and Rhythm: Normal rate.     Pulses: Normal pulses.  Pulmonary:     Effort: Pulmonary effort is normal.     Breath sounds: Normal breath sounds.  Musculoskeletal: Normal range of motion.  Skin:  General: Skin is warm and dry.  Neurological:     Mental Status: She is alert and oriented to person, place, and time.  Psychiatric:        Mood and Affect: Mood normal.        Behavior: Behavior normal.     RECENT LABS AND TESTS: BMET    Component Value Date/Time   NA 141 01/17/2019 1334   K 4.3 01/17/2019 1334   CL 106 01/17/2019 1334   CO2 22 01/17/2019 1334   GLUCOSE 95 01/17/2019 1334   BUN 9 01/17/2019 1334   CREATININE 0.81 01/17/2019 1334   CALCIUM 9.3 01/17/2019 1334   GFRNONAA 89 01/17/2019 1334   GFRAA 103 01/17/2019 1334   Lab Results  Component Value Date   HGBA1C 4.7 (L) 12/05/2018   HGBA1C 5.0 09/04/2018   Lab Results  Component Value Date   INSULIN 21.7 12/05/2018   INSULIN 13.6 09/04/2018   CBC    Component Value Date/Time   WBC 5.8 09/04/2018 0832   RBC 4.55 09/04/2018 0832   HGB 13.3 09/04/2018 0832   HCT 40.6 09/04/2018  0832   MCV 89 09/04/2018 0832   MCH 29.2 09/04/2018 0832   MCHC 32.8 09/04/2018 0832   RDW 13.6 09/04/2018 0832   LYMPHSABS 2.0 09/04/2018 0832   EOSABS 0.1 09/04/2018 0832   BASOSABS 0.0 09/04/2018 0832   Iron/TIBC/Ferritin/ %Sat No results found for: IRON, TIBC, FERRITIN, IRONPCTSAT Lipid Panel     Component Value Date/Time   CHOL 215 (H) 12/05/2018 1112   TRIG 68 12/05/2018 1112   HDL 56 12/05/2018 1112   LDLCALC 145 (H) 12/05/2018 1112   Hepatic Function Panel     Component Value Date/Time   PROT 7.0 01/17/2019 1334   ALBUMIN 4.1 01/17/2019 1334   AST 16 01/17/2019 1334   ALT 15 01/17/2019 1334   ALKPHOS 107 01/17/2019 1334   BILITOT 0.3 01/17/2019 1334      Component Value Date/Time   TSH 1.810 09/04/2018 0901      OBESITY BEHAVIORAL INTERVENTION VISIT  Today's visit was # 10   Starting weight: 200 lbs Starting date: 09/03/2018 Today's weight : 194 lbs Today's date: 01/22/2019 Total lbs lost to date: 6    ASK: We discussed the diagnosis of obesity with Dossie ArbourLakeisha Henson today and Erica Henson agreed to give us permission to discuss obesity behavioral modification therapy today.  ASSESS: Erica Henson has the diagnosis of obesity and her BMI today is 32.28 Erica Henson is in the action stage of change   ADVISE: Erica Henson was educated on the multiple health risks of obesity as well as the benefit of weight loss to improve her health. She was advised of the need for long term treatment and the importance of lifestyle modifications to improve her current health and to decrease her risk of future health problems.  AGREE: Multiple dietary modification options and treatment options were discussed and  Erica Henson agreed to follow the recommendations documented in the above note.  ARRANGE: Erica Henson was educated on the importance of frequent visits to treat obesity as outlined per CMS and USPSTF guidelines and agreed to schedule her next follow up appointment today.  I, Burt KnackSharon  Martin, am acting as transcriptionist for Debbra RidingAlexandria Kadolph, MD  I have reviewed the above documentation for accuracy and completeness, and I agree with the above. - Debbra RidingAlexandria Kadolph, MD

## 2019-01-30 ENCOUNTER — Other Ambulatory Visit (INDEPENDENT_AMBULATORY_CARE_PROVIDER_SITE_OTHER): Payer: Self-pay | Admitting: Family Medicine

## 2019-01-30 DIAGNOSIS — E559 Vitamin D deficiency, unspecified: Secondary | ICD-10-CM

## 2019-02-02 ENCOUNTER — Encounter (INDEPENDENT_AMBULATORY_CARE_PROVIDER_SITE_OTHER): Payer: Self-pay | Admitting: Family Medicine

## 2019-02-03 ENCOUNTER — Encounter (INDEPENDENT_AMBULATORY_CARE_PROVIDER_SITE_OTHER): Payer: Self-pay | Admitting: Family Medicine

## 2019-02-06 ENCOUNTER — Ambulatory Visit (INDEPENDENT_AMBULATORY_CARE_PROVIDER_SITE_OTHER): Payer: Medicare Other | Admitting: Family Medicine

## 2019-02-12 ENCOUNTER — Ambulatory Visit (INDEPENDENT_AMBULATORY_CARE_PROVIDER_SITE_OTHER): Payer: Medicare Other | Admitting: Physician Assistant

## 2019-02-12 ENCOUNTER — Encounter (INDEPENDENT_AMBULATORY_CARE_PROVIDER_SITE_OTHER): Payer: Self-pay | Admitting: Physician Assistant

## 2019-02-12 ENCOUNTER — Other Ambulatory Visit: Payer: Self-pay

## 2019-02-12 VITALS — BP 98/67 | HR 75 | Temp 98.0°F | Ht 65.0 in | Wt 193.0 lb

## 2019-02-12 DIAGNOSIS — Z9189 Other specified personal risk factors, not elsewhere classified: Secondary | ICD-10-CM

## 2019-02-12 DIAGNOSIS — N3000 Acute cystitis without hematuria: Secondary | ICD-10-CM | POA: Diagnosis not present

## 2019-02-12 DIAGNOSIS — E559 Vitamin D deficiency, unspecified: Secondary | ICD-10-CM | POA: Diagnosis not present

## 2019-02-12 DIAGNOSIS — E669 Obesity, unspecified: Secondary | ICD-10-CM

## 2019-02-12 DIAGNOSIS — Z6832 Body mass index (BMI) 32.0-32.9, adult: Secondary | ICD-10-CM

## 2019-02-12 DIAGNOSIS — E8881 Metabolic syndrome: Secondary | ICD-10-CM

## 2019-02-12 MED ORDER — VITAMIN D (ERGOCALCIFEROL) 1.25 MG (50000 UNIT) PO CAPS: 50000.0000 [IU] | ORAL_CAPSULE | ORAL | 0 refills | Status: DC

## 2019-02-12 MED ORDER — LIRAGLUTIDE 18 MG/3ML ~~LOC~~ SOPN: 1.2000 mg | PEN_INJECTOR | Freq: Every morning | SUBCUTANEOUS | 0 refills | Status: DC

## 2019-02-12 NOTE — Progress Notes (Signed)
Office: [phone]  /  Fax: [phone]   HPI:   Chief Complaint: OBESITY Erica Henson is here to discuss her progress with her obesity treatment plan. She is on the Category 2 plan/intermittent fasting/journaling and is following her eating plan approximately 96-97% of the time. She states she is power walking 45-60 minutes 3-4 times per week. Erica Henson is intermittently fasting and journaling, but not journaling consistently, and therefore is unsure whether or not she is reaching her calorie or protein goals. She is frustrated with lack of weight loss. Erica Henson has been advised that there is no good medical evidence for intermittent fasting. Her weight is 193 lb (87.5 kg) today and has had a weight loss of 1 pound over a period of 3 weeks since her last visit. She has lost 7 lbs since starting treatment with [place].  Vitamin D deficiency Erica Henson has a diagnosis of Vitamin D deficiency. She is currently taking prescription Vit D and denies nausea, vomiting or muscle weakness.  At risk for osteopenia and osteoporosis Erica Henson is at higher risk of osteopenia and osteoporosis due to Vitamin D deficiency.   Insulin Resistance Erica Henson has a diagnosis of insulin resistance based on her elevated fasting insulin level >5. Although Erica Henson's blood glucose readings are still under good control, insulin resistance puts her at greater risk of metabolic syndrome and diabetes. She is on Victoza currently and continues to work on diet and exercise to decrease risk of diabetes. No nausea, vomiting, or diarrhea.  ASSESSMENT AND PLAN:  Vitamin D deficiency - Plan: Vitamin D, Ergocalciferol, (DRISDOL) 1.25 MG (50000 UT) CAPS capsule  Insulin resistance - Plan: liraglutide (VICTOZA) 18 MG/3ML SOPN  At risk for osteoporosis  Class 1 obesity with serious comorbidity and body mass index (BMI) of 32.0 to 32.9 in adult, unspecified obesity type  PLAN:  Vitamin D Deficiency Yoshi was informed that low  Vitamin D levels contributes to fatigue and are associated with obesity, breast, and colon cancer. She agrees to continue to take prescription Vit D @ 50,000 IU every 14 days #2 with 0 refillsand will follow-up for routine testing of Vitamin D, at least 2-3 times per year. She was informed of the risk of over-replacement of Vitamin D and agrees to not increase her dose unless she discusses this with [place] first. Bernadette agrees to follow-up with our clinic in 2 weeks.  At risk for osteopenia and osteoporosis Erica Henson was given extended  (15 minutes) osteoporosis prevention counseling today. Syan is at risk for osteopenia and osteoporsis due to her Vitamin D deficiency. She was encouraged to take her Vitamin D and follow her higher calcium diet and increase strengthening exercise to help strengthen her bones and decrease her risk of osteopenia and osteoporosis.  Insulin Resistance Erica Henson will continue to work on weight loss, exercise, and decreasing simple carbohydrates in her diet to help decrease the risk of diabetes. We dicussed metformin including benefits and risks. She was informed that eating too many simple carbohydrates or too many calories at one sitting increases the likelihood of GI side effects. Erica Henson's Victoza dose was increased to 1.2 mg #2 pens with 0 refills. She agrees to follow-up with our clinic in 2 weeks.  Obesity Erica Henson is currently in the action stage of change. As such, her goal is to continue with weight loss efforts. She has agreed to keep a food journal with 1250-1350 calories and 90 grams of protein daily. Erica Henson has been instructed to work up to a goal of 150 minutes of  combined cardio and strengthening exercise per week for weight loss and overall health benefits. We discussed the following Behavioral Modification Strategies today: no skipping meals, work on meal planning and easy cooking plans.  Erica Henson has agreed to follow-up with our clinic in 2 weeks. She  was informed of the importance of frequent follow-up visits to maximize her success with intensive lifestyle modifications for her multiple health conditions.  ALLERGIES: Allergies  Allergen Reactions   Codeine Itching   Lunesta [Eszopiclone] Nausea And Vomiting    MEDICATIONS: Current Outpatient Medications on File Prior to Visit  Medication Sig Dispense Refill   amphetamine-dextroamphetamine (ADDERALL) 10 MG tablet Take 10 mg by mouth daily with breakfast.     b complex vitamins tablet Take 1 tablet by mouth daily.     omeprazole (PRILOSEC) 20 MG capsule Take 20 mg by mouth daily.     Probiotic Product (PROBIOTIC-10 PO) Take 1 tablet by mouth daily.     vitamin C (ASCORBIC ACID) 250 MG tablet Take 250 mg by mouth daily.     No current facility-administered medications on file prior to visit.     PAST MEDICAL HISTORY: Past Medical History:  Diagnosis Date   Back pain    Constipation    Depression    Fibromyalgia    Gallbladder problem    GERD (gastroesophageal reflux disease)    Hyperlipidemia    Hypertension    Joint pain    Multiple sclerosis (HCC)    Osteopenia    Swelling    Vitamin D deficiency     PAST SURGICAL HISTORY: Past Surgical History:  Procedure Laterality Date   ABDOMINAL HYSTERECTOMY     ABDOMINOPLASTY     BREAST REDUCTION SURGERY     CHOLECYSTECTOMY     TMJ ARTHROPLASTY      SOCIAL HISTORY: Social History   Tobacco Use   Smoking status: Never Smoker   Smokeless tobacco: Never Used  Substance Use Topics   Alcohol use: Yes    Comment: social   Drug use: Never    FAMILY HISTORY: Family History  Problem Relation Age of Onset   Diabetes Mother    Hypertension Mother    Stroke Father    Hypertension Father    ROS: Review of Systems  Gastrointestinal: Negative for diarrhea, nausea and vomiting.  Musculoskeletal:       Negative for muscle weakness.   PHYSICAL EXAM: Blood pressure 98/67, pulse 75,  temperature 98 F (36.7 C), temperature source Oral, height 5\' 5"  (1.651 m), weight 193 lb (87.5 kg), SpO2 99 %. Body mass index is 32.12 kg/m. Physical Exam Vitals signs reviewed.  Constitutional:      Appearance: Normal appearance. She is obese.  Cardiovascular:     Rate and Rhythm: Normal rate.     Pulses: Normal pulses.  Pulmonary:     Effort: Pulmonary effort is normal.     Breath sounds: Normal breath sounds.  Musculoskeletal: Normal range of motion.  Skin:    General: Skin is warm and dry.  Neurological:     Mental Status: She is alert and oriented to person, place, and time.  Psychiatric:        Behavior: Behavior normal.   RECENT LABS AND TESTS: BMET    Component Value Date/Time   NA 141 01/17/2019 1334   K 4.3 01/17/2019 1334   CL 106 01/17/2019 1334   CO2 22 01/17/2019 1334   GLUCOSE 95 01/17/2019 1334   BUN 9 01/17/2019 1334  CREATININE 0.81 01/17/2019 1334   CALCIUM 9.3 01/17/2019 1334   GFRNONAA 89 01/17/2019 1334   GFRAA 103 01/17/2019 1334   Lab Results  Component Value Date   HGBA1C 4.7 (L) 12/05/2018   HGBA1C 5.0 09/04/2018   Lab Results  Component Value Date   INSULIN 21.7 12/05/2018   INSULIN 13.6 09/04/2018   CBC    Component Value Date/Time   WBC 5.8 09/04/2018 0832   RBC 4.55 09/04/2018 0832   HGB 13.3 09/04/2018 0832   HCT 40.6 09/04/2018 0832   MCV 89 09/04/2018 0832   MCH 29.2 09/04/2018 0832   MCHC 32.8 09/04/2018 0832   RDW 13.6 09/04/2018 0832   LYMPHSABS 2.0 09/04/2018 0832   EOSABS 0.1 09/04/2018 0832   BASOSABS 0.0 09/04/2018 0832   Iron/TIBC/Ferritin/ %Sat No results found for: IRON, TIBC, FERRITIN, IRONPCTSAT Lipid Panel     Component Value Date/Time   CHOL 215 (H) 12/05/2018 1112   TRIG 68 12/05/2018 1112   HDL 56 12/05/2018 1112   LDLCALC 145 (H) 12/05/2018 1112   Hepatic Function Panel     Component Value Date/Time   PROT 7.0 01/17/2019 1334   ALBUMIN 4.1 01/17/2019 1334   AST 16 01/17/2019 1334   ALT  15 01/17/2019 1334   ALKPHOS 107 01/17/2019 1334   BILITOT 0.3 01/17/2019 1334      Component Value Date/Time   TSH 1.810 09/04/2018 0901   Results for Erica ArbourCRAIG, Naoko (MRN [id number]) as of 02/12/2019 14:38  Ref. Range 12/05/2018 11:12  Vitamin D, 25-Hydroxy Latest Ref Range: 30.0 - 100.0 ng/mL 51.5   OBESITY BEHAVIORAL INTERVENTION VISIT  Today's visit was #11   Starting weight: 200 lbs Starting date: 09/03/2018 Today's weight: 193 lbs  Today's date: 02/12/2019 Total lbs lost to date: 7    02/12/2019  Height 5\' 5"  (1.651 m)  Weight 193 lb (87.5 kg)  BMI (Calculated) 32.12  BLOOD PRESSURE - SYSTOLIC 98  BLOOD PRESSURE - DIASTOLIC 67   Body Fat % 39.4 %  Total Body Water (lbs) 75.2 lbs   ASK: We discussed the diagnosis of obesity with Erica ArbourLakeisha Henson today and Mick SellLakeisha agreed to give us permission to discuss obesity behavioral modification therapy today.  ASSESS: Mick SellLakeisha has the diagnosis of obesity and her BMI today is 32.2. Mick SellLakeisha is in the action stage of change.   ADVISE: Mick SellLakeisha was educated on the multiple health risks of obesity as well as the benefit of weight loss to improve her health. She was advised of the need for long term treatment and the importance of lifestyle modifications to improve her current health and to decrease her risk of future health problems.  AGREE: Multiple dietary modification options and treatment options were discussed and  Mick SellLakeisha agreed to follow the recommendations documented in the above note.  ARRANGE: Mick SellLakeisha was educated on the importance of frequent visits to treat obesity as outlined per CMS and USPSTF guidelines and agreed to schedule her next follow up appointment today.  Fernanda DrumI, Denise Haag, am acting as transcriptionist for Alois Clicheracey Aguilar, PA-C I, Alois Clicheracey Aguilar, PA-C have reviewed above note and agree with its content

## 2019-02-25 ENCOUNTER — Ambulatory Visit (INDEPENDENT_AMBULATORY_CARE_PROVIDER_SITE_OTHER): Payer: Medicare Other | Admitting: Family Medicine

## 2019-02-26 ENCOUNTER — Ambulatory Visit (INDEPENDENT_AMBULATORY_CARE_PROVIDER_SITE_OTHER): Payer: Medicare Other | Admitting: Family Medicine

## 2019-03-11 ENCOUNTER — Other Ambulatory Visit: Payer: Self-pay

## 2019-03-11 ENCOUNTER — Ambulatory Visit (INDEPENDENT_AMBULATORY_CARE_PROVIDER_SITE_OTHER): Payer: Medicare Other | Admitting: Physician Assistant

## 2019-03-11 ENCOUNTER — Encounter (INDEPENDENT_AMBULATORY_CARE_PROVIDER_SITE_OTHER): Payer: Self-pay | Admitting: Physician Assistant

## 2019-03-11 VITALS — BP 124/86 | HR 77 | Temp 98.4°F | Ht 65.0 in | Wt 194.0 lb

## 2019-03-11 DIAGNOSIS — E559 Vitamin D deficiency, unspecified: Secondary | ICD-10-CM

## 2019-03-11 DIAGNOSIS — E8881 Metabolic syndrome: Secondary | ICD-10-CM

## 2019-03-11 DIAGNOSIS — E669 Obesity, unspecified: Secondary | ICD-10-CM

## 2019-03-11 DIAGNOSIS — Z6832 Body mass index (BMI) 32.0-32.9, adult: Secondary | ICD-10-CM | POA: Diagnosis not present

## 2019-03-11 MED ORDER — VITAMIN D (ERGOCALCIFEROL) 1.25 MG (50000 UNIT) PO CAPS: 50000.0000 [IU] | ORAL_CAPSULE | ORAL | 0 refills | Status: DC

## 2019-03-13 NOTE — Progress Notes (Signed)
Office: [phone]  /  Fax: [phone]   HPI:   Chief Complaint: OBESITY Erica Henson is here to discuss her progress with her obesity treatment plan. She is keeping a food journal with 1200 calories and 85 grams of protein and is following her eating plan approximately 95% of the time. She states she is exercising 0 minutes 0 times per week. Erica Henson reports that she sometimes is journaling and sometimes not. She is frustrated with lack of weight loss and wants to restart a structured plan.  Her weight is 194 lb (88 kg) today and has had a weight gain of 1 lb since her last visit. She has lost 6 lbs since starting treatment with [place].  Vitamin D deficiency Erica Henson has a diagnosis of Vitamin D deficiency. She is currently taking prescription Vit D and denies nausea, vomiting or muscle weakness.  Insulin Resistance Erica Henson has a diagnosis of insulin resistance based on her elevated fasting insulin level >5. Although Erica Henson's blood glucose readings are still under good control, insulin resistance puts her at greater risk of metabolic syndrome and diabetes. She is taking Victoza currently and continues to work on diet and exercise to decrease risk of diabetes. No polyphagia.  ASSESSMENT AND PLAN:  Vitamin D deficiency - Plan: Vitamin D, Ergocalciferol, (DRISDOL) 1.25 MG (50000 UT) CAPS capsule  Insulin resistance  Class 1 obesity with serious comorbidity and body mass index (BMI) of 32.0 to 32.9 in adult, unspecified obesity type  PLAN:  Vitamin D Deficiency Erica Henson was informed that low Vitamin D levels contributes to fatigue and are associated with obesity, breast, and colon cancer. She agrees to continue to take prescription Vit D @ 50,000 IU every 14 days #2 with 0 refills and will follow-up for routine testing of Vitamin D, at least 2-3 times per year. She was informed of the risk of over-replacement of Vitamin D and agrees to not increase her dose unless she discusses this with  [place] first. Erica Henson agrees to follow-up with our clinic in 3 weeks.  Insulin Resistance Erica Henson will continue to work on weight loss, exercise, and decreasing simple carbohydrates in her diet to help decrease the risk of diabetes. We dicussed metformin including benefits and risks. She was informed that eating too many simple carbohydrates or too many calories at one sitting increases the likelihood of GI side effects. Erica Henson will continue her Victoza and weight loss. She will follow-up with [place] as directed to monitor her progress.  Obesity Erica Henson is currently in the action stage of change. As such, her goal is to continue with weight loss efforts. She has agreed to follow the Category 2 plan. Erica Henson has been instructed to work up to a goal of 150 minutes of combined cardio and strengthening exercise per week for weight loss and overall health benefits. We discussed the following Behavioral Modification Strategies today: work on meal planning and easy cooking plans.  Erica Henson has agreed to follow-up with our clinic in 3 weeks. She was informed of the importance of frequent follow-up visits to maximize her success with intensive lifestyle modifications for her multiple health conditions.  ALLERGIES: Allergies  Allergen Reactions   Codeine Itching   Lunesta [Eszopiclone] Nausea And Vomiting    MEDICATIONS: Current Outpatient Medications on File Prior to Visit  Medication Sig Dispense Refill   amphetamine-dextroamphetamine (ADDERALL) 10 MG tablet Take 10 mg by mouth daily with breakfast.     b complex vitamins tablet Take 1 tablet by mouth daily.     liraglutide (  VICTOZA) 18 MG/3ML SOPN Inject 0.2 mLs (1.2 mg total) into the skin every morning. 2 pen 0   omeprazole (PRILOSEC) 20 MG capsule Take 20 mg by mouth daily.     Probiotic Product (PROBIOTIC-10 PO) Take 1 tablet by mouth daily.     vitamin C (ASCORBIC ACID) 250 MG tablet Take 250 mg by mouth daily.     No current  facility-administered medications on file prior to visit.     PAST MEDICAL HISTORY: Past Medical History:  Diagnosis Date   Back pain    Constipation    Depression    Fibromyalgia    Gallbladder problem    GERD (gastroesophageal reflux disease)    Hyperlipidemia    Hypertension    Joint pain    Multiple sclerosis (HCC)    Osteopenia    Swelling    Vitamin D deficiency     PAST SURGICAL HISTORY: Past Surgical History:  Procedure Laterality Date   ABDOMINAL HYSTERECTOMY     ABDOMINOPLASTY     BREAST REDUCTION SURGERY     CHOLECYSTECTOMY     TMJ ARTHROPLASTY      SOCIAL HISTORY: Social History   Tobacco Use   Smoking status: Never Smoker   Smokeless tobacco: Never Used  Substance Use Topics   Alcohol use: Yes    Comment: social   Drug use: Never    FAMILY HISTORY: Family History  Problem Relation Age of Onset   Diabetes Mother    Hypertension Mother    Stroke Father    Hypertension Father    ROS: Review of Systems  Gastrointestinal: Negative for nausea and vomiting.  Musculoskeletal:       Negative for muscle weakness.   PHYSICAL EXAM: Blood pressure 124/86, pulse 77, temperature 98.4 F (36.9 C), temperature source Oral, height 5\' 5"  (1.651 m), weight 194 lb (88 kg), SpO2 97 %. Body mass index is 32.28 kg/m. Physical Exam Vitals signs reviewed.  Constitutional:      Appearance: Normal appearance. She is obese.  Cardiovascular:     Rate and Rhythm: Normal rate.     Pulses: Normal pulses.  Pulmonary:     Effort: Pulmonary effort is normal.     Breath sounds: Normal breath sounds.  Musculoskeletal: Normal range of motion.  Skin:    General: Skin is warm and dry.  Neurological:     Mental Status: She is alert and oriented to person, place, and time.  Psychiatric:        Behavior: Behavior normal.   RECENT LABS AND TESTS: BMET    Component Value Date/Time   NA 141 01/17/2019 1334   K 4.3 01/17/2019 1334   CL 106  01/17/2019 1334   CO2 22 01/17/2019 1334   GLUCOSE 95 01/17/2019 1334   BUN 9 01/17/2019 1334   CREATININE 0.81 01/17/2019 1334   CALCIUM 9.3 01/17/2019 1334   GFRNONAA 89 01/17/2019 1334   GFRAA 103 01/17/2019 1334   Lab Results  Component Value Date   HGBA1C 4.7 (L) 12/05/2018   HGBA1C 5.0 09/04/2018   Lab Results  Component Value Date   INSULIN 21.7 12/05/2018   INSULIN 13.6 09/04/2018   CBC    Component Value Date/Time   WBC 5.8 09/04/2018 0832   RBC 4.55 09/04/2018 0832   HGB 13.3 09/04/2018 0832   HCT 40.6 09/04/2018 0832   MCV 89 09/04/2018 0832   MCH 29.2 09/04/2018 0832   MCHC 32.8 09/04/2018 0832   RDW 13.6 09/04/2018 0832  LYMPHSABS 2.0 09/04/2018 0832   EOSABS 0.1 09/04/2018 0832   BASOSABS 0.0 09/04/2018 0832   Iron/TIBC/Ferritin/ %Sat No results found for: IRON, TIBC, FERRITIN, IRONPCTSAT Lipid Panel     Component Value Date/Time   CHOL 215 (H) 12/05/2018 1112   TRIG 68 12/05/2018 1112   HDL 56 12/05/2018 1112   LDLCALC 145 (H) 12/05/2018 1112   Hepatic Function Panel     Component Value Date/Time   PROT 7.0 01/17/2019 1334   ALBUMIN 4.1 01/17/2019 1334   AST 16 01/17/2019 1334   ALT 15 01/17/2019 1334   ALKPHOS 107 01/17/2019 1334   BILITOT 0.3 01/17/2019 1334      Component Value Date/Time   TSH 1.810 09/04/2018 0901   Results for GIULIANNA, ROCHA (MRN [id number]) as of 03/13/2019 08:50  Ref. Range 12/05/2018 11:12  Vitamin D, 25-Hydroxy Latest Ref Range: 30.0 - 100.0 ng/mL 51.5   OBESITY BEHAVIORAL INTERVENTION VISIT  Today's visit was #12  Starting weight: 200 lbs Starting date: 09/03/2018 Today's weight: 194 lbs  Today's date: 03/11/2019 Total lbs lost to date: 6 At least 15 minutes were spent on discussing the following behavioral intervention visit.    03/11/2019  Height 5\' 5"  (1.651 m)  Weight 194 lb (88 kg)  BMI (Calculated) 32.28  BLOOD PRESSURE - SYSTOLIC 130  BLOOD PRESSURE - DIASTOLIC 86   Body Fat % 86.5 %  Total  Body Water (lbs) 76.4 lbs   ASK: We discussed the diagnosis of obesity with Erica Henson today and Erica Henson agreed to give [place] permission to discuss obesity behavioral modification therapy today.  ASSESS: Erica Henson has the diagnosis of obesity and her BMI today is 32.3. Erica Henson is in the action stage of change.   ADVISE: Erica Henson was educated on the multiple health risks of obesity as well as the benefit of weight loss to improve her health. She was advised of the need for long term treatment and the importance of lifestyle modifications to improve her current health and to decrease her risk of future health problems.  AGREE: Multiple dietary modification options and treatment options were discussed and  Erica Henson agreed to follow the recommendations documented in the above note.  ARRANGE: Erica Henson was educated on the importance of frequent visits to treat obesity as outlined per CMS and USPSTF guidelines and agreed to schedule her next follow up appointment today.  Erica Henson, am acting as transcriptionist for Abby Potash, PA-C I, Abby Potash, PA-C have reviewed above note and agree with its content

## 2019-03-18 DIAGNOSIS — A6000 Herpesviral infection of urogenital system, unspecified: Secondary | ICD-10-CM | POA: Insufficient documentation

## 2019-03-24 ENCOUNTER — Other Ambulatory Visit (INDEPENDENT_AMBULATORY_CARE_PROVIDER_SITE_OTHER): Payer: Self-pay | Admitting: Family Medicine

## 2019-03-24 DIAGNOSIS — E559 Vitamin D deficiency, unspecified: Secondary | ICD-10-CM

## 2019-04-01 ENCOUNTER — Other Ambulatory Visit: Payer: Self-pay

## 2019-04-01 ENCOUNTER — Ambulatory Visit (INDEPENDENT_AMBULATORY_CARE_PROVIDER_SITE_OTHER): Payer: Medicare Other | Admitting: Physician Assistant

## 2019-04-01 ENCOUNTER — Encounter (INDEPENDENT_AMBULATORY_CARE_PROVIDER_SITE_OTHER): Payer: Self-pay | Admitting: Family Medicine

## 2019-04-01 ENCOUNTER — Encounter (INDEPENDENT_AMBULATORY_CARE_PROVIDER_SITE_OTHER): Payer: Self-pay | Admitting: Physician Assistant

## 2019-04-01 VITALS — BP 104/72 | HR 59 | Temp 98.3°F | Ht 65.0 in | Wt 195.0 lb

## 2019-04-01 DIAGNOSIS — Z6832 Body mass index (BMI) 32.0-32.9, adult: Secondary | ICD-10-CM | POA: Diagnosis not present

## 2019-04-01 DIAGNOSIS — E559 Vitamin D deficiency, unspecified: Secondary | ICD-10-CM | POA: Diagnosis not present

## 2019-04-01 DIAGNOSIS — E669 Obesity, unspecified: Secondary | ICD-10-CM

## 2019-04-01 DIAGNOSIS — E7849 Other hyperlipidemia: Secondary | ICD-10-CM

## 2019-04-01 NOTE — Telephone Encounter (Signed)
Please advise 

## 2019-04-02 ENCOUNTER — Other Ambulatory Visit (INDEPENDENT_AMBULATORY_CARE_PROVIDER_SITE_OTHER): Payer: Self-pay | Admitting: Physician Assistant

## 2019-04-02 DIAGNOSIS — E559 Vitamin D deficiency, unspecified: Secondary | ICD-10-CM

## 2019-04-02 LAB — LIPID PANEL WITH LDL/HDL RATIO
Cholesterol, Total: 182 mg/dL (ref 100–199)
HDL: 55 mg/dL (ref >39)
LDL Chol Calc (NIH): 109 mg/dL — ABNORMAL HIGH (ref 0–99)
LDL/HDL Ratio: 2 ratio (ref 0.0–3.2)
Triglycerides: 99 mg/dL (ref 0–149)
VLDL Cholesterol Cal: 18 mg/dL (ref 5–40)

## 2019-04-02 LAB — VITAMIN D 25 HYDROXY (VIT D DEFICIENCY, FRACTURES): Vit D, 25-Hydroxy: 42.4 ng/mL (ref 30.0–100.0)

## 2019-04-02 NOTE — Telephone Encounter (Signed)
Please advise 

## 2019-04-02 NOTE — Progress Notes (Signed)
Office: [phone]  /  Fax: [phone]   HPI:   Chief Complaint: OBESITY Erica Henson is here to discuss her progress with her obesity treatment plan. She is on the  follow the Category 2 plan and is following her eating plan approximately 97 % of the time. She states she is exercising 0 minutes 0 times per week. Erica Henson is very frustrated with the lack of weight loss, as he feels like she has been following the plan well. She states she is bloated and she doesn't want to eat all of the meat on the plan. Her weight is 195 lb (88.5 kg) today and has had a weight gain of 1 pound over a period of 3 weeks since her last visit. She has lost 5 lbs since starting treatment with [place].  Vitamin D deficiency Erica Henson has a diagnosis of vitamin D deficiency. Erica Henson is currently taking vit D every other week and she denies nausea, vomiting or muscle weakness.  Hyperlipidemia Erica Henson has hyperlipidemia and she is not on medications. She has been trying to improve her cholesterol levels with intensive lifestyle modification including a low saturated fat diet, exercise and weight loss. She denies any chest pain.  ASSESSMENT AND PLAN:  Vitamin D deficiency - Plan: VITAMIN D 25 Hydroxy (Vit-D Deficiency, Fractures)  Other hyperlipidemia - Plan: Lipid Panel With LDL/HDL Ratio  Class 1 obesity with serious comorbidity and body mass index (BMI) of 32.0 to 32.9 in adult, unspecified obesity type  PLAN:  Vitamin D Deficiency Erica Henson was informed that low vitamin D levels contributes to fatigue and are associated with obesity, breast, and colon cancer. Erica Henson will continue taking prescription Vit D @50 ,000 IU every other week and she will follow up for routine testing of vitamin D, at least 2-3 times per year. She was informed of the risk of over-replacement of vitamin D and agrees to not increase her dose unless she discusses this with [place] first. We will check labs and Erica Henson agrees to follow up with  our clinic in 2 weeks.  Hyperlipidemia Erica Henson was informed of the American Heart Association Guidelines emphasizing intensive lifestyle modifications as the first line treatment for hyperlipidemia. We discussed many lifestyle modifications today in depth, and Erica Henson will continue to work on decreasing saturated fats such as fatty red meat, butter and many fried foods. She will also increase vegetables and lean protein in her diet and continue to work on exercise and weight loss efforts. We will check labs and Erica Henson will follow up as directed.  Obesity Erica Henson is currently in the action stage of change. As such, her goal is to continue with weight loss efforts She has agreed to follow the Pescatarian eating plan Erica Henson has been instructed to work up to a goal of 150 minutes of combined cardio and strengthening exercise per week for weight loss and overall health benefits. We discussed the following Behavioral Modification Strategies today: keeping healthy foods in the home and work on meal planning and easy cooking plans  Erica Henson has agreed to follow up with our clinic in 2 weeks. She was informed of the importance of frequent follow up visits to maximize her success with intensive lifestyle modifications for her multiple health conditions.  ALLERGIES: Allergies  Allergen Reactions   Codeine Itching   Lunesta [Eszopiclone] Nausea And Vomiting    MEDICATIONS: Current Outpatient Medications on File Prior to Visit  Medication Sig Dispense Refill   amphetamine-dextroamphetamine (ADDERALL) 10 MG tablet Take 10 mg by mouth daily with breakfast.  b complex vitamins tablet Take 1 tablet by mouth daily.     liraglutide (VICTOZA) 18 MG/3ML SOPN Inject 0.2 mLs (1.2 mg total) into the skin every morning. 2 pen 0   omeprazole (PRILOSEC) 20 MG capsule Take 20 mg by mouth daily.     Probiotic Product (PROBIOTIC-10 PO) Take 1 tablet by mouth daily.     vitamin C (ASCORBIC ACID) 250  MG tablet Take 250 mg by mouth daily.     Vitamin D, Ergocalciferol, (DRISDOL) 1.25 MG (50000 UT) CAPS capsule Take 1 capsule (50,000 Units total) by mouth every 14 (fourteen) days. 2 capsule 0   No current facility-administered medications on file prior to visit.     PAST MEDICAL HISTORY: Past Medical History:  Diagnosis Date   Back pain    Constipation    Depression    Fibromyalgia    Gallbladder problem    GERD (gastroesophageal reflux disease)    Hyperlipidemia    Hypertension    Joint pain    Multiple sclerosis (HCC)    Osteopenia    Swelling    Vitamin D deficiency     PAST SURGICAL HISTORY: Past Surgical History:  Procedure Laterality Date   ABDOMINAL HYSTERECTOMY     ABDOMINOPLASTY     BREAST REDUCTION SURGERY     CHOLECYSTECTOMY     TMJ ARTHROPLASTY      SOCIAL HISTORY: Social History   Tobacco Use   Smoking status: Never Smoker   Smokeless tobacco: Never Used  Substance Use Topics   Alcohol use: Yes    Comment: social   Drug use: Never    FAMILY HISTORY: Family History  Problem Relation Age of Onset   Diabetes Mother    Hypertension Mother    Stroke Father    Hypertension Father     ROS: Review of Systems  Constitutional: Negative for weight loss.  Cardiovascular: Negative for chest pain.  Gastrointestinal: Negative for nausea and vomiting.  Musculoskeletal:       Negative for muscle weakness    PHYSICAL EXAM: Blood pressure 104/72, pulse (!) 59, temperature 98.3 F (36.8 C), temperature source Oral, height 5\' 5"  (1.651 m), weight 195 lb (88.5 kg), SpO2 100 %. Body mass index is 32.45 kg/m. Physical Exam Vitals signs reviewed.  Constitutional:      Appearance: Normal appearance. She is well-developed. She is obese.  Cardiovascular:     Rate and Rhythm: Normal rate.  Pulmonary:     Effort: Pulmonary effort is normal.  Musculoskeletal: Normal range of motion.  Skin:    General: Skin is warm and dry.    Neurological:     Mental Status: She is alert and oriented to person, place, and time.  Psychiatric:        Mood and Affect: Mood normal.        Behavior: Behavior normal.     RECENT LABS AND TESTS: BMET    Component Value Date/Time   NA 141 01/17/2019 1334   K 4.3 01/17/2019 1334   CL 106 01/17/2019 1334   CO2 22 01/17/2019 1334   GLUCOSE 95 01/17/2019 1334   BUN 9 01/17/2019 1334   CREATININE 0.81 01/17/2019 1334   CALCIUM 9.3 01/17/2019 1334   GFRNONAA 89 01/17/2019 1334   GFRAA 103 01/17/2019 1334   Lab Results  Component Value Date   HGBA1C 4.7 (L) 12/05/2018   HGBA1C 5.0 09/04/2018   Lab Results  Component Value Date   INSULIN 21.7 12/05/2018   INSULIN  13.6 09/04/2018   CBC    Component Value Date/Time   WBC 5.8 09/04/2018 0832   RBC 4.55 09/04/2018 0832   HGB 13.3 09/04/2018 0832   HCT 40.6 09/04/2018 0832   MCV 89 09/04/2018 0832   MCH 29.2 09/04/2018 0832   MCHC 32.8 09/04/2018 0832   RDW 13.6 09/04/2018 0832   LYMPHSABS 2.0 09/04/2018 0832   EOSABS 0.1 09/04/2018 0832   BASOSABS 0.0 09/04/2018 0832   Iron/TIBC/Ferritin/ %Sat No results found for: IRON, TIBC, FERRITIN, IRONPCTSAT Lipid Panel     Component Value Date/Time   CHOL 182 04/01/2019 1200   TRIG 99 04/01/2019 1200   HDL 55 04/01/2019 1200   LDLCALC 145 (H) 12/05/2018 1112   Hepatic Function Panel     Component Value Date/Time   PROT 7.0 01/17/2019 1334   ALBUMIN 4.1 01/17/2019 1334   AST 16 01/17/2019 1334   ALT 15 01/17/2019 1334   ALKPHOS 107 01/17/2019 1334   BILITOT 0.3 01/17/2019 1334      Component Value Date/Time   TSH 1.810 09/04/2018 0901     Ref. Range 04/01/2019 12:00  Vitamin D, 25-Hydroxy Latest Ref Range: 30.0 - 100.0 ng/mL 42.4    OBESITY BEHAVIORAL INTERVENTION VISIT  Today's visit was # 13   Starting weight: 200 lbs Starting date: 09/03/2018 Today's weight : 195 lbs Today's date: 04/01/2019 Total lbs lost to date: 5    04/01/2019  Height 5\' 5"  (1.651  m)  Weight 195 lb (88.5 kg)  BMI (Calculated) 32.45  BLOOD PRESSURE - SYSTOLIC 016  BLOOD PRESSURE - DIASTOLIC 72   Body Fat % 43 %  Total Body Water (lbs) 76.4 lbs    ASK: We discussed the diagnosis of obesity with Erica Henson today and Erica Henson agreed to give [place] permission to discuss obesity behavioral modification therapy today.  ASSESS: Erica Henson has the diagnosis of obesity and her BMI today is 32.45 Erica Henson is in the action stage of change   ADVISE: Erica Henson was educated on the multiple health risks of obesity as well as the benefit of weight loss to improve her health. She was advised of the need for long term treatment and the importance of lifestyle modifications to improve her current health and to decrease her risk of future health problems.  AGREE: Multiple dietary modification options and treatment options were discussed and  Erica Henson agreed to follow the recommendations documented in the above note.  ARRANGE: Erica Henson was educated on the importance of frequent visits to treat obesity as outlined per CMS and USPSTF guidelines and agreed to schedule her next follow up appointment today.  Corey Skains, am acting as transcriptionist for Abby Potash, PA-C I, Abby Potash, PA-C have reviewed above note and agree with its content

## 2019-04-15 ENCOUNTER — Ambulatory Visit (INDEPENDENT_AMBULATORY_CARE_PROVIDER_SITE_OTHER): Payer: Medicare Other | Admitting: Physician Assistant

## 2019-04-29 ENCOUNTER — Other Ambulatory Visit (INDEPENDENT_AMBULATORY_CARE_PROVIDER_SITE_OTHER): Payer: Self-pay | Admitting: Physician Assistant

## 2019-04-29 DIAGNOSIS — E559 Vitamin D deficiency, unspecified: Secondary | ICD-10-CM

## 2019-04-30 DIAGNOSIS — G35 Multiple sclerosis: Secondary | ICD-10-CM | POA: Diagnosis not present

## 2019-04-30 DIAGNOSIS — R9082 White matter disease, unspecified: Secondary | ICD-10-CM | POA: Diagnosis not present

## 2019-04-30 DIAGNOSIS — M2550 Pain in unspecified joint: Secondary | ICD-10-CM | POA: Diagnosis not present

## 2019-04-30 DIAGNOSIS — M5030 Other cervical disc degeneration, unspecified cervical region: Secondary | ICD-10-CM | POA: Diagnosis not present

## 2019-05-05 DIAGNOSIS — G35 Multiple sclerosis: Secondary | ICD-10-CM | POA: Diagnosis not present

## 2019-05-09 DIAGNOSIS — R17 Unspecified jaundice: Secondary | ICD-10-CM | POA: Diagnosis not present

## 2019-05-09 DIAGNOSIS — R300 Dysuria: Secondary | ICD-10-CM | POA: Diagnosis not present

## 2019-05-09 DIAGNOSIS — M542 Cervicalgia: Secondary | ICD-10-CM | POA: Diagnosis not present

## 2019-05-10 ENCOUNTER — Other Ambulatory Visit (INDEPENDENT_AMBULATORY_CARE_PROVIDER_SITE_OTHER): Payer: Self-pay | Admitting: Physician Assistant

## 2019-05-10 DIAGNOSIS — E8881 Metabolic syndrome: Secondary | ICD-10-CM

## 2019-05-12 ENCOUNTER — Other Ambulatory Visit (INDEPENDENT_AMBULATORY_CARE_PROVIDER_SITE_OTHER): Payer: Self-pay | Admitting: Physician Assistant

## 2019-05-12 DIAGNOSIS — E559 Vitamin D deficiency, unspecified: Secondary | ICD-10-CM

## 2019-05-19 DIAGNOSIS — E875 Hyperkalemia: Secondary | ICD-10-CM | POA: Diagnosis not present

## 2019-05-19 DIAGNOSIS — Z23 Encounter for immunization: Secondary | ICD-10-CM | POA: Diagnosis not present

## 2019-05-19 DIAGNOSIS — E876 Hypokalemia: Secondary | ICD-10-CM | POA: Diagnosis not present

## 2019-06-12 DIAGNOSIS — G8929 Other chronic pain: Secondary | ICD-10-CM | POA: Diagnosis not present

## 2019-06-12 DIAGNOSIS — M542 Cervicalgia: Secondary | ICD-10-CM | POA: Diagnosis not present

## 2019-06-12 DIAGNOSIS — Z711 Person with feared health complaint in whom no diagnosis is made: Secondary | ICD-10-CM | POA: Diagnosis not present

## 2019-06-23 DIAGNOSIS — R300 Dysuria: Secondary | ICD-10-CM | POA: Diagnosis not present

## 2019-06-24 DIAGNOSIS — M25872 Other specified joint disorders, left ankle and foot: Secondary | ICD-10-CM | POA: Diagnosis not present

## 2019-06-24 DIAGNOSIS — M25532 Pain in left wrist: Secondary | ICD-10-CM | POA: Diagnosis not present

## 2019-06-24 DIAGNOSIS — M19079 Primary osteoarthritis, unspecified ankle and foot: Secondary | ICD-10-CM | POA: Diagnosis not present

## 2019-06-24 DIAGNOSIS — Z1231 Encounter for screening mammogram for malignant neoplasm of breast: Secondary | ICD-10-CM | POA: Diagnosis not present

## 2019-07-15 ENCOUNTER — Other Ambulatory Visit: Payer: Self-pay

## 2019-07-15 ENCOUNTER — Ambulatory Visit (INDEPENDENT_AMBULATORY_CARE_PROVIDER_SITE_OTHER): Payer: Medicare Other | Admitting: Family Medicine

## 2019-07-15 ENCOUNTER — Encounter (INDEPENDENT_AMBULATORY_CARE_PROVIDER_SITE_OTHER): Payer: Self-pay | Admitting: Family Medicine

## 2019-07-15 VITALS — BP 107/75 | HR 70 | Temp 98.3°F | Ht 65.0 in | Wt 200.0 lb

## 2019-07-15 DIAGNOSIS — Z6833 Body mass index (BMI) 33.0-33.9, adult: Secondary | ICD-10-CM | POA: Diagnosis not present

## 2019-07-15 DIAGNOSIS — E669 Obesity, unspecified: Secondary | ICD-10-CM

## 2019-07-15 DIAGNOSIS — E7849 Other hyperlipidemia: Secondary | ICD-10-CM

## 2019-07-15 DIAGNOSIS — E559 Vitamin D deficiency, unspecified: Secondary | ICD-10-CM

## 2019-07-15 DIAGNOSIS — G35 Multiple sclerosis: Secondary | ICD-10-CM

## 2019-07-15 DIAGNOSIS — E8881 Metabolic syndrome: Secondary | ICD-10-CM

## 2019-07-15 MED ORDER — VITAMIN D (ERGOCALCIFEROL) 1.25 MG (50000 UNIT) PO CAPS: 50000.0000 [IU] | ORAL_CAPSULE | ORAL | 0 refills | Status: DC

## 2019-07-15 MED ORDER — PREDNISONE 5 MG PO TABS: 5.0000 mg | ORAL_TABLET | Freq: Every day | ORAL | 0 refills | Status: DC

## 2019-07-17 NOTE — Progress Notes (Signed)
Office: [phone]  /  Fax: [phone]   HPI:  Chief Complaint: OBESITY Erica Henson is here to discuss her progress with her obesity treatment plan. She is on the Pescatarian eating plan with intermittent fasting and states she is following her eating plan approximately 50% of the time. She states she is exercising 0 minutes 0 times per week.  Erica Henson has a history of MS and is not on medications. She is having a flare with numbness and aches. She also has a history of fibromyalgia with worsened pain and prefers no medication. She tries to exercise every other day. She likes intermittent fasting, 18 hours fast. She does have trouble with her meat protein, possibly due to worsening flare of MS.  Today's visit was #14 Starting weight: 200 lbs Starting date: 09/03/2018 Today's weight: 200 lbs  Today's date: 07/15/2019 Total lbs lost to date: 0 Total lbs lost since last in-office visit: 0  Vitamin D deficiency Erica Henson has a diagnosis of Vitamin D deficiency and is taking prescription Vitamin D. Last Vitamin D 42.4 on 04/01/2019.  Hyperlipidemia Erica Henson has hyperlipidemia with LDL of 109 on 04/01/2019.  Insulin Resistance Erica Henson tried metformin and did not tolerate it. Last insulin was 21.7 on 12/05/2018.  MS Flare Erica Henson is having a flare of her MS, which is inhibiting exercise.  ASSESSMENT AND PLAN:  Vitamin D deficiency - Plan: Vitamin D, Ergocalciferol, (DRISDOL) 1.25 MG (50000 UT) CAPS capsule  Other hyperlipidemia  Insulin resistance  Multiple sclerosis (HCC),flare - Plan: predniSONE (DELTASONE) 5 MG tablet  Class 1 obesity with serious comorbidity and body mass index (BMI) of 33.0 to 33.9 in adult, unspecified obesity type  PLAN:  Vitamin D Deficiency Erica Henson was informed that low Vitamin D levels contributes to fatigue and are associated with obesity, breast, and colon cancer. She agrees to continue to take prescription Vit D @ 50,000 IU every week #4 with  0 refills and will follow-up for routine testing of Vitamin D, at least 2-3 times per year. She was informed of the risk of over-replacement of Vitamin D and agrees to not increase her dose unless she discusses this with [place] first. Erica Henson agrees to follow-up with our clinic in 3 weeks.  Hyperlipidemia Intensive lifestyle modifications as the first line treatment for hyperlipidemia. We discussed many lifestyle modifications today and Erica Henson will continue to work on diet, exercise and weight loss efforts. We will monitor.  Insulin Resistance Erica Henson will continue to work on weight loss, exercise, and decreasing simple carbohydrates to help decrease the risk of diabetes. Erica Henson agreed to follow-up with [place] as directed to closely monitor her progress.  MS Flare Erica Henson was given a prescription for prednisone 5 mg (instructions 6-5-4-3-2-1-0) #21 with 0 refills. We discussed exercise, sleep, and less inflammatory foods.  Obesity Erica Henson is currently in the action stage of change. As such, her goal is to continue with weight loss efforts. She has agreed to follow the Pescatarian eating plan. Erica Henson has been instructed to work up to a goal of 150 minutes of combined cardio and strengthening exercise per week for weight loss and overall health benefits. We discussed the following Behavioral Modification Strategies today: increasing lean protein intake, decreasing simple carbohydrates, increase H20 intake, work on meal planning and easy cooking plans.  Erica Henson has agreed to follow-up with our clinic in 3 weeks. She was informed of the importance of frequent follow-up visits to maximize her success with intensive lifestyle modifications for her multiple health conditions.  ALLERGIES: Allergies  Allergen  Reactions  . Codeine Itching  . Lunesta [Eszopiclone] Nausea And Vomiting    MEDICATIONS: Current Outpatient Medications on File Prior to Visit  Medication Sig Dispense Refill  . b  complex vitamins tablet Take 1 tablet by mouth daily.    Marland Kitchen liraglutide (VICTOZA) 18 MG/3ML SOPN Inject 0.2 mLs (1.2 mg total) into the skin every morning. 2 pen 0  . Probiotic Product (PROBIOTIC-10 PO) Take 1 tablet by mouth daily.    . vitamin C (ASCORBIC ACID) 250 MG tablet Take 250 mg by mouth daily.    Marland Kitchen amphetamine-dextroamphetamine (ADDERALL) 10 MG tablet Take 10 mg by mouth daily with breakfast.     No current facility-administered medications on file prior to visit.    PAST MEDICAL HISTORY: Past Medical History:  Diagnosis Date  . Back pain   . Constipation   . Depression   . Fibromyalgia   . Gallbladder problem   . GERD (gastroesophageal reflux disease)   . Hyperlipidemia   . Hypertension   . Joint pain   . Multiple sclerosis (HCC)   . Osteopenia   . Swelling   . Vitamin D deficiency     PAST SURGICAL HISTORY: Past Surgical History:  Procedure Laterality Date  . ABDOMINAL HYSTERECTOMY    . ABDOMINOPLASTY    . BREAST REDUCTION SURGERY    . CHOLECYSTECTOMY    . TMJ ARTHROPLASTY      SOCIAL HISTORY: Social History   Tobacco Use  . Smoking status: Never Smoker  . Smokeless tobacco: Never Used  Substance Use Topics  . Alcohol use: Yes    Comment: social  . Drug use: Never    FAMILY HISTORY: Family History  Problem Relation Age of Onset  . Diabetes Mother   . Hypertension Mother   . Stroke Father   . Hypertension Father    ROS: Review of Systems  Constitutional: Negative for weight loss.  Musculoskeletal:       Positive for MS.   PHYSICAL EXAM: Blood pressure 107/75, pulse 70, temperature 98.3 F (36.8 C), temperature source Oral, height 5\' 5"  (1.651 m), weight 200 lb (90.7 kg), SpO2 99 %. Body mass index is 33.28 kg/m.  General: Cooperative, alert, well developed, in no acute distress. HEENT: Conjunctivae and lids unremarkable. Neck: No thyromegaly.  Cardiovascular: Regular rhythm.  Lungs: Normal work of breathing. Extremities: No edema.    Neurologic: No focal deficits.   RECENT LABS AND TESTS: BMET    Component Value Date/Time   NA 141 01/17/2019 1334   K 4.3 01/17/2019 1334   CL 106 01/17/2019 1334   CO2 22 01/17/2019 1334   GLUCOSE 95 01/17/2019 1334   BUN 9 01/17/2019 1334   CREATININE 0.81 01/17/2019 1334   CALCIUM 9.3 01/17/2019 1334   GFRNONAA 89 01/17/2019 1334   GFRAA 103 01/17/2019 1334   Lab Results  Component Value Date   HGBA1C 4.7 (L) 12/05/2018   HGBA1C 5.0 09/04/2018   Lab Results  Component Value Date   INSULIN 21.7 12/05/2018   INSULIN 13.6 09/04/2018   CBC    Component Value Date/Time   WBC 5.8 09/04/2018 0832   RBC 4.55 09/04/2018 0832   HGB 13.3 09/04/2018 0832   HCT 40.6 09/04/2018 0832   MCV 89 09/04/2018 0832   MCH 29.2 09/04/2018 0832   MCHC 32.8 09/04/2018 0832   RDW 13.6 09/04/2018 0832   LYMPHSABS 2.0 09/04/2018 0832   EOSABS 0.1 09/04/2018 0832   BASOSABS 0.0 09/04/2018 0832   Iron/TIBC/Ferritin/ %  Sat No results found for: IRON, TIBC, FERRITIN, IRONPCTSAT Lipid Panel     Component Value Date/Time   CHOL 182 04/01/2019 1200   TRIG 99 04/01/2019 1200   HDL 55 04/01/2019 1200   LDLCALC 109 (H) 04/01/2019 1200   Hepatic Function Panel     Component Value Date/Time   PROT 7.0 01/17/2019 1334   ALBUMIN 4.1 01/17/2019 1334   AST 16 01/17/2019 1334   ALT 15 01/17/2019 1334   ALKPHOS 107 01/17/2019 1334   BILITOT 0.3 01/17/2019 1334      Component Value Date/Time   TSH 1.810 09/04/2018 0901    OBESITY BEHAVIORAL INTERVENTION VISIT DOCUMENTATION FOR INSURANCE (~15 minutes)  ASK: We discussed the diagnosis of obesity with Erica Henson today and Erica Henson agreed to give [place] permission to discuss obesity behavioral modification therapy today.  ASSESS: Erica Henson has the diagnosis of obesity and her BMI today is 33.4. Erica Henson is in the action stage of change.   ADVISE: Erica Henson was educated on the multiple health risks of obesity as well as the benefit of  weight loss to improve her health. She was advised of the need for long term treatment and the importance of lifestyle modifications to improve her current health and to decrease her risk of future health problems.  AGREE: Multiple dietary modification options and treatment options were discussed and  Erica Henson agreed to follow the recommendations documented in the above note.  ARRANGE: Erica Henson was educated on the importance of frequent visits to treat obesity as outlined per CMS and USPSTF guidelines and agreed to schedule her next follow up appointment today.  Erica Henson, am acting as Location manager for [employer]. Erica China, DO  I have reviewed the above documentation for accuracy and completeness, and I agree with the above. Erica Deutscher, DO

## 2019-08-05 ENCOUNTER — Ambulatory Visit (INDEPENDENT_AMBULATORY_CARE_PROVIDER_SITE_OTHER): Payer: Medicare Other | Admitting: Family Medicine

## 2019-08-05 ENCOUNTER — Other Ambulatory Visit: Payer: Self-pay

## 2019-08-05 VITALS — BP 108/77 | HR 83 | Temp 98.2°F | Ht 65.0 in | Wt 199.0 lb

## 2019-08-05 DIAGNOSIS — K5909 Other constipation: Secondary | ICD-10-CM

## 2019-08-05 DIAGNOSIS — E88819 Insulin resistance, unspecified: Secondary | ICD-10-CM

## 2019-08-05 DIAGNOSIS — E559 Vitamin D deficiency, unspecified: Secondary | ICD-10-CM | POA: Diagnosis not present

## 2019-08-05 DIAGNOSIS — R5383 Other fatigue: Secondary | ICD-10-CM

## 2019-08-05 DIAGNOSIS — G35 Multiple sclerosis: Secondary | ICD-10-CM

## 2019-08-05 DIAGNOSIS — E7849 Other hyperlipidemia: Secondary | ICD-10-CM

## 2019-08-05 DIAGNOSIS — E8881 Metabolic syndrome: Secondary | ICD-10-CM

## 2019-08-05 DIAGNOSIS — R739 Hyperglycemia, unspecified: Secondary | ICD-10-CM

## 2019-08-05 DIAGNOSIS — E669 Obesity, unspecified: Secondary | ICD-10-CM

## 2019-08-05 DIAGNOSIS — Z6833 Body mass index (BMI) 33.0-33.9, adult: Secondary | ICD-10-CM | POA: Diagnosis not present

## 2019-08-06 ENCOUNTER — Telehealth (INDEPENDENT_AMBULATORY_CARE_PROVIDER_SITE_OTHER): Payer: Self-pay | Admitting: Family Medicine

## 2019-08-06 DIAGNOSIS — B9689 Other specified bacterial agents as the cause of diseases classified elsewhere: Secondary | ICD-10-CM | POA: Diagnosis not present

## 2019-08-06 DIAGNOSIS — N760 Acute vaginitis: Secondary | ICD-10-CM | POA: Diagnosis not present

## 2019-08-06 LAB — CBC WITH DIFFERENTIAL/PLATELET
Basophils Absolute: 0 10*3/uL (ref 0.0–0.2)
Basos: 1 %
EOS (ABSOLUTE): 0.1 10*3/uL (ref 0.0–0.4)
Eos: 2 %
Hematocrit: 43.1 % (ref 34.0–46.6)
Hemoglobin: 14.9 g/dL (ref 11.1–15.9)
Immature Grans (Abs): 0 10*3/uL (ref 0.0–0.1)
Immature Granulocytes: 0 %
Lymphocytes Absolute: 1.7 10*3/uL (ref 0.7–3.1)
Lymphs: 38 %
MCH: 29.6 pg (ref 26.6–33.0)
MCHC: 34.6 g/dL (ref 31.5–35.7)
MCV: 86 fL (ref 79–97)
Monocytes Absolute: 0.3 10*3/uL (ref 0.1–0.9)
Monocytes: 7 %
Neutrophils Absolute: 2.3 10*3/uL (ref 1.4–7.0)
Neutrophils: 52 %
Platelets: 278 10*3/uL (ref 150–450)
RBC: 5.03 x10E6/uL (ref 3.77–5.28)
RDW: 13.2 % (ref 11.7–15.4)
WBC: 4.4 10*3/uL (ref 3.4–10.8)

## 2019-08-06 LAB — FSH/LH
FSH: 5.6 m[IU]/mL
LH: 4.5 m[IU]/mL

## 2019-08-06 LAB — HEMOGLOBIN A1C
Est. average glucose Bld gHb Est-mCnc: 97 mg/dL
Hgb A1c MFr Bld: 5 % (ref 4.8–5.6)

## 2019-08-06 LAB — COMPREHENSIVE METABOLIC PANEL
ALT: 12 IU/L (ref 0–32)
AST: 16 IU/L (ref 0–40)
Albumin/Globulin Ratio: 1.3 (ref 1.2–2.2)
Albumin: 4.5 g/dL (ref 3.8–4.8)
Alkaline Phosphatase: 111 IU/L (ref 39–117)
BUN/Creatinine Ratio: 13 (ref 9–23)
BUN: 11 mg/dL (ref 6–24)
Bilirubin Total: 0.5 mg/dL (ref 0.0–1.2)
CO2: 22 mmol/L (ref 20–29)
Calcium: 9.8 mg/dL (ref 8.7–10.2)
Chloride: 104 mmol/L (ref 96–106)
Creatinine, Ser: 0.86 mg/dL (ref 0.57–1.00)
GFR calc Af Amer: 95 mL/min/{1.73_m2} (ref >59)
GFR calc non Af Amer: 82 mL/min/{1.73_m2} (ref >59)
Globulin, Total: 3.4 g/dL (ref 1.5–4.5)
Glucose: 77 mg/dL (ref 65–99)
Potassium: 4.4 mmol/L (ref 3.5–5.2)
Sodium: 140 mmol/L (ref 134–144)
Total Protein: 7.9 g/dL (ref 6.0–8.5)

## 2019-08-06 LAB — LIPID PANEL WITH LDL/HDL RATIO
Cholesterol, Total: 264 mg/dL — ABNORMAL HIGH (ref 100–199)
HDL: 78 mg/dL (ref >39)
LDL Chol Calc (NIH): 172 mg/dL — ABNORMAL HIGH (ref 0–99)
LDL/HDL Ratio: 2.2 ratio (ref 0.0–3.2)
Triglycerides: 82 mg/dL (ref 0–149)
VLDL Cholesterol Cal: 14 mg/dL (ref 5–40)

## 2019-08-06 LAB — T3: T3, Total: 129 ng/dL (ref 71–180)

## 2019-08-06 LAB — INSULIN, RANDOM: INSULIN: 11.7 u[IU]/mL (ref 2.6–24.9)

## 2019-08-06 LAB — TSH: TSH: 3.28 u[IU]/mL (ref 0.450–4.500)

## 2019-08-06 LAB — TESTOSTERONE,FREE AND TOTAL
Testosterone, Free: 2.4 pg/mL (ref 0.0–4.2)
Testosterone: 23 ng/dL (ref 8–48)

## 2019-08-06 LAB — T4, FREE: Free T4: 1.33 ng/dL (ref 0.82–1.77)

## 2019-08-06 LAB — VITAMIN D 25 HYDROXY (VIT D DEFICIENCY, FRACTURES): Vit D, 25-Hydroxy: 35.3 ng/mL (ref 30.0–100.0)

## 2019-08-06 MED ORDER — VITAMIN D (ERGOCALCIFEROL) 1.25 MG (50000 UNIT) PO CAPS: 50000.0000 [IU] | ORAL_CAPSULE | ORAL | 0 refills | Status: DC

## 2019-08-06 MED ORDER — POLYETHYLENE GLYCOL 3350 17 GM/SCOOP PO POWD: 17.0000 g | Freq: Two times a day (BID) | ORAL | 0 refills | Status: AC | PRN

## 2019-08-06 NOTE — Telephone Encounter (Signed)
Patient is not able to use My Chart.  She states that Dr. Earlene Plater was to send prescription request to [facility] [address] for Vit. D & Miralax.  Please advise.  Thank you.

## 2019-08-06 NOTE — Progress Notes (Signed)
Chief Complaint: OBESITY Erica Henson is here to discuss her progress with her obesity treatment plan along with follow-up of her obesity related diagnoses. Erica Henson is on the [employer] and states she is following her eating plan approximately 90% of the time. Erica Henson states she is exercising at home and using free weights for 30 minutes 4 times per week.  Today's visit was #: 15 Starting weight: 200 lbs  Starting date: 09/03/2018 Today's weight: 199 lbs Today's date: 08/06/2019 Total lbs lost to date: 1 pound Total lbs lost since last in-office visit: 0  Interim History: Erica Henson says she feels much better.  The prednisone helpted to get over her MS flare.  She has been exercising.  She forgets to eat some days until late.  She likes healthy foods and drinks lots of water.  The plan is for her to eat during her window.  Subjective:   1. Vitamin D deficiency Erica Henson has a diagnosis of vitamin D deficiency. She is currently taking vit D. She denies nausea, vomiting or muscle weakness.  2. Other hyperlipidemia Erica Henson has hyperlipidemia and has been trying to improve her cholesterol levels with intensive lifestyle modification including a low saturated fat diet, exercise and weight loss. She denies any chest pain, claudication or myalgias.  3. Insulin resistance Erica Henson has a diagnosis of insulin resistance based on her elevated fasting insulin level >5. Although Erica Henson's blood glucose readings are still under good control, insulin resistance puts her at greater risk of metabolic syndrome and diabetes. She is not taking metformin currently and continues to work on diet and exercise to decrease risk of diabetes.  4. MS (multiple sclerosis) (HCC) Her recent MS flare has improved with the help of prednisone.  5. Other constipation Erica Henson has longstanding issues with constipation.   6. Hyperglycemia Erica Henson has a history of some elevated blood glucose readings without  a diagnosis of diabetes. She denies polyphagia.  Assessment/Plan:   1. Vitamin D deficiency Low Vitamin D level contributes to fatigue and are associated with obesity, breast, and colon cancer. She agrees to continue to take prescription Vitamin D @50 ,000 IU every week and will follow-up for routine testing of vitamin D, at least 2-3 times per year to avoid over-replacement.  The goal is to get her vitamin D level above 50.  - Vitamin D, Ergocalciferol, (DRISDOL) 1.25 MG (50000 UT) CAPS capsule; Take 1 capsule (50,000 Units total) by mouth every 7 (seven) days.  Dispense: 4 capsule; Refill: 0 - Vitamin D (25 hydroxy)  2. Other hyperlipidemia This problem is chronic. Cardiovascular risk and specific lipid/LDL goals reviewed.  We discussed several lifestyle modifications today and Erica Henson will continue to work on diet, exercise and weight loss efforts. Orders and follow up as documented in patient record.   - Comprehensive Metabolic Panel (CMET) - Lipid Panel With LDL/HDL Ratio  3. Insulin resistance Erica Henson will continue to work on weight loss, exercise, and decreasing simple carbohydrates to help decrease the risk of diabetes. Erica Henson agreed to follow-up with Erica Henson as directed to closely monitor her progress.  She does not tolerate metformin or Victoza.  - Comprehensive Metabolic Panel (CMET) - CBC w/Diff/Platelet - HgB A1c - Insulin, random  4. MS (multiple sclerosis) (HCC) Symptoms have improved. Will monitor.  5. Other constipation This is longstanding, but is worsening.  Discussed treatment with Erica Henson.  -Polyethylene glycol powder (GLYCOLAX/Henson) 17 GM/SCOOP powder; Take 17 g by mouth 2 (two) times daily as needed.  Dispense:  3350 g; Refill: 0  7. Class 1 obesity with serious comorbidity and body mass index (BMI) of 33.0 to 33.9 in adult, unspecified obesity type Erica Henson is currently in the action stage of change. As such, her goal is to continue with weight  loss efforts. She has agreed to [employer].   We discussed the following exercise goals today: Older adults should follow the adult guidelines. When older adults cannot meet the adult guidelines, they should be as physically active as their abilities and conditions will allow.  Older adults should do exercises that maintain or improve balance if they are at risk of falling.   We discussed the following behavioral modification strategies today: decreasing simple carbohydrates, meal planning and cooking strategies and keeping healthy foods in the home.  Erica Henson has agreed to follow-up with our clinic in 2 weeks. She was informed of the importance of frequent follow-up visits to maximize her success with intensive lifestyle modifications for her multiple health conditions.  Objective:   Blood pressure 108/77, pulse 83, temperature 98.2 F (36.8 C), temperature source Oral, height 5\' 5"  (1.651 m), weight 199 lb (90.3 kg), SpO2 98 %. Body mass index is 33.12 kg/m.  General: Cooperative, alert, well developed, in no acute distress. HEENT: Conjunctivae and lids unremarkable. Neck: No thyromegaly.  Cardiovascular: Regular rhythm.  Lungs: Normal work of breathing. Extremities: No edema.  Neurologic: No focal deficits.   Lab Results  Component Value Date   CREATININE 0.86 08/05/2019   BUN 11 08/05/2019   NA 140 08/05/2019   K 4.4 08/05/2019   CL 104 08/05/2019   CO2 22 08/05/2019   Lab Results  Component Value Date   ALT 12 08/05/2019   AST 16 08/05/2019   ALKPHOS 111 08/05/2019   BILITOT 0.5 08/05/2019   Lab Results  Component Value Date   HGBA1C 5.0 08/05/2019   HGBA1C 4.7 (L) 12/05/2018   HGBA1C 5.0 09/04/2018   Lab Results  Component Value Date   INSULIN WILL FOLLOW 08/05/2019   INSULIN 21.7 12/05/2018   INSULIN 13.6 09/04/2018   Lab Results  Component Value Date   TSH 3.280 08/05/2019   Lab Results  Component Value Date   CHOL 264 (H) 08/05/2019   HDL  78 08/05/2019   LDLCALC 172 (H) 08/05/2019   TRIG 82 08/05/2019   Lab Results  Component Value Date   WBC 4.4 08/05/2019   HGB 14.9 08/05/2019   HCT 43.1 08/05/2019   MCV 86 08/05/2019   PLT 278 08/05/2019   No results found for: IRON, TIBC, FERRITIN Obesity Behavioral Intervention Visit Documentation for Insurance (15 Minutes):   ASK: We discussed the diagnosis of obesity with 10/03/2019 today and Erica Henson agreed to give Erica Henson permission to discuss obesity behavioral modification therapy today.  ASSESS: Erica Henson has the diagnosis of obesity and her BMI today is 33.1. Erica Henson is in the action stage of change.   ADVISE: Erica Henson was educated on the multiple health risks of obesity as well as the benefit of weight loss to improve her health. She was advised of the need for long term treatment and the importance of lifestyle modifications to improve her current health and to decrease her risk of future health problems.  AGREE: Multiple dietary modification options and treatment options were discussed and Nathaniel agreed to follow the recommendations documented in the above note.  ARRANGE: Erica Henson was educated on the importance of frequent visits to treat obesity as outlined per CMS and USPSTF guidelines and agreed  to schedule her next follow up appointment today.  Attestation Statements:   Reviewed by clinician on day of visit: allergies, medications, problem list, medical history, surgical history, family history, social history and previous encounter notes.  This visit occurred during the SARS-CoV-2 public health emergency. Safety protocols were in place, including screening questions prior to the visit, additional usage of staff PPE, and extensive cleaning of exam room while observing appropriate contact time as indicated for disinfecting solutions. (CPT [id number])  I, Water quality scientist, CMA, am acting as transcriptionist for [employer], DO.  I have reviewed the above documentation  for accuracy and completeness, and I agree with the above. Briscoe Deutscher, DO

## 2019-08-08 ENCOUNTER — Encounter (INDEPENDENT_AMBULATORY_CARE_PROVIDER_SITE_OTHER): Payer: Self-pay | Admitting: Family Medicine

## 2019-08-11 NOTE — Telephone Encounter (Signed)
Sorry for the delay. I finally spoke with the pharmacy and they said that you had already picked it up. I hope you have a good day. Charlynne Pander, CMA

## 2019-08-15 DIAGNOSIS — R300 Dysuria: Secondary | ICD-10-CM | POA: Diagnosis not present

## 2019-08-20 ENCOUNTER — Encounter (INDEPENDENT_AMBULATORY_CARE_PROVIDER_SITE_OTHER): Payer: Self-pay | Admitting: Family Medicine

## 2019-08-20 ENCOUNTER — Other Ambulatory Visit: Payer: Self-pay

## 2019-08-20 ENCOUNTER — Ambulatory Visit (INDEPENDENT_AMBULATORY_CARE_PROVIDER_SITE_OTHER): Payer: Medicare Other | Admitting: Family Medicine

## 2019-08-20 VITALS — BP 104/73 | HR 78 | Temp 98.5°F | Ht 65.0 in | Wt 202.0 lb

## 2019-08-20 DIAGNOSIS — E8881 Metabolic syndrome: Secondary | ICD-10-CM

## 2019-08-20 DIAGNOSIS — G35 Multiple sclerosis: Secondary | ICD-10-CM | POA: Diagnosis not present

## 2019-08-20 DIAGNOSIS — E7849 Other hyperlipidemia: Secondary | ICD-10-CM

## 2019-08-20 DIAGNOSIS — E559 Vitamin D deficiency, unspecified: Secondary | ICD-10-CM

## 2019-08-20 DIAGNOSIS — E669 Obesity, unspecified: Secondary | ICD-10-CM

## 2019-08-20 DIAGNOSIS — Z6833 Body mass index (BMI) 33.0-33.9, adult: Secondary | ICD-10-CM

## 2019-08-20 NOTE — Progress Notes (Signed)
Chief Complaint:   OBESITY Erica Henson is here to discuss her progress with her obesity treatment plan along with follow-up of her obesity related diagnoses. Erica Henson is on the [employer] and states she is following her eating plan approximately 89% of the time. Erica Henson states she is walking and using weights for 30 minutes 2 times per week.  Today's visit was #: 16 Starting weight: 200 lbs Starting date: 09/03/2018 Today's weight: 202 lbs Today's date: 08/20/2019 Total lbs lost to date: 0 Total lbs lost since last in-office visit: 0  Interim History: Patient has had another MS flare.  She is not getting any sleep.  Subjective:   1. Insulin resistance Erica Henson has a diagnosis of insulin resistance based on her elevated fasting insulin level >5. She continues to work on diet and exercise to decrease her risk of diabetes.  Lab Results  Component Value Date   INSULIN 11.7 08/05/2019   INSULIN 21.7 12/05/2018   INSULIN 13.6 09/04/2018   Lab Results  Component Value Date   HGBA1C 5.0 08/05/2019   2. Vitamin D deficiency Erica Henson's Vitamin D level was 35.3 on 08/05/2019. She is currently taking vit D. She denies nausea, vomiting or muscle weakness.  3. Other hyperlipidemia Erica Henson has hyperlipidemia and has been trying to improve her cholesterol levels with intensive lifestyle modification including a low saturated fat diet, exercise and weight loss. She denies any chest pain, claudication or myalgias.  Lab Results  Component Value Date   ALT 12 08/05/2019   AST 16 08/05/2019   ALKPHOS 111 08/05/2019   BILITOT 0.5 08/05/2019   Lab Results  Component Value Date   CHOL 264 (H) 08/05/2019   HDL 78 08/05/2019   LDLCALC 172 (H) 08/05/2019   TRIG 82 08/05/2019   4. Multiple sclerosis (HCC),flare Erica Henson endorses having an MS flare. This inhibits exercise and sleep. It increases her risk of depression. She prefers to avoid traditional Western treatments and prefers  Integrative treatments.  Assessment/Plan:   1. Insulin resistance Erica Henson will continue to work on weight loss, exercise, and decreasing simple carbohydrates to help decrease the risk of diabetes. Erica Henson agreed to follow-up with [place] as directed to closely monitor her progress.  2. Vitamin D deficiency Low Vitamin D level contributes to fatigue and are associated with obesity, breast, and colon cancer. She agrees to continue to take prescription Vitamin D @50 ,000 IU every week and will follow-up for routine testing of Vitamin D, at least 2-3 times per year to avoid over-replacement.  3. Other hyperlipidemia Cardiovascular risk and specific lipid/LDL goals reviewed.  We discussed several lifestyle modifications today and Erica Henson will continue to work on diet, exercise and weight loss efforts. Orders and follow up as documented in patient record.   Counseling Intensive lifestyle modifications are the first line treatment for this issue. . Dietary changes: Increase soluble fiber. Decrease simple carbohydrates. . Exercise changes: Moderate to vigorous-intensity aerobic activity 150 minutes per week if tolerated. . Lipid-lowering medications: see documented in medical record.  4. Multiple sclerosis (HCC),flare Will refer to [facility].  5. Class 1 obesity with serious comorbidity and body mass index (BMI) of 33.0 to 33.9 in adult, unspecified obesity type Erica Henson is currently in the action stage of change. As such, her goal is to continue with weight loss efforts. She has agreed to the Erica Henson.   Exercise goals: For substantial health benefits, adults should do at least 150 minutes (2 hours and 30 minutes) a week of  moderate-intensity, or 75 minutes (1 hour and 15 minutes) a week of vigorous-intensity aerobic physical activity, or an equivalent combination of moderate- and vigorous-intensity aerobic activity. Aerobic activity should be performed in episodes of at least  10 minutes, and preferably, it should be spread throughout the week. Adults should also include muscle-strengthening activities that involve all major muscle groups on 2 or more days a week.  Behavioral modification strategies: Focus on sleep.  Nilani has agreed to follow-up with our clinic in 2 weeks with Dr. Owens Henson. She was informed of the importance of frequent follow-up visits to maximize her success with intensive lifestyle modifications for her multiple health conditions.   Objective:   Blood pressure 104/73, pulse 78, temperature 98.5 F (36.9 C), temperature source Oral, height 5\' 5"  (1.651 m), weight 202 lb (91.6 kg), SpO2 100 %. Body mass index is 33.61 kg/m.  General: Cooperative, alert, well developed, in no acute distress. HEENT: Conjunctivae and lids unremarkable. Cardiovascular: Regular rhythm.  Lungs: Normal work of breathing. Neurologic: No focal deficits.   Lab Results  Component Value Date   CREATININE 0.86 08/05/2019   BUN 11 08/05/2019   NA 140 08/05/2019   K 4.4 08/05/2019   CL 104 08/05/2019   CO2 22 08/05/2019   Lab Results  Component Value Date   ALT 12 08/05/2019   AST 16 08/05/2019   ALKPHOS 111 08/05/2019   BILITOT 0.5 08/05/2019   Lab Results  Component Value Date   HGBA1C 5.0 08/05/2019   HGBA1C 4.7 (L) 12/05/2018   HGBA1C 5.0 09/04/2018   Lab Results  Component Value Date   INSULIN 11.7 08/05/2019   INSULIN 21.7 12/05/2018   INSULIN 13.6 09/04/2018   Lab Results  Component Value Date   TSH 3.280 08/05/2019   Lab Results  Component Value Date   CHOL 264 (H) 08/05/2019   HDL 78 08/05/2019   LDLCALC 172 (H) 08/05/2019   TRIG 82 08/05/2019   Lab Results  Component Value Date   WBC 4.4 08/05/2019   HGB 14.9 08/05/2019   HCT 43.1 08/05/2019   MCV 86 08/05/2019   PLT 278 08/05/2019   Attestation Statements:   Reviewed by clinician on day of visit: allergies, medications, problem list, medical history, surgical history,  family history, social  history, and previous encounter notes.  I performed a medically necessary appropriate examination and/or evaluation. I discussed the assessment and treatment plan with the patient. The patient was provided an opportunity to ask questions and all were answered. The patient agreed with the plan and demonstrated an understanding of the instructions. I placed a referral to [facility] for alternative options to treat MS. Clinical information was updated and documented in the EMR. Time spent on visit including pre-visit chart review and post-visit care was 50 minutes.  I, Water quality scientist, CMA, am acting as Location manager for [employer], DO.  I have reviewed the above documentation for accuracy and completeness, and I agree with the above. Briscoe Deutscher, DO

## 2019-08-24 ENCOUNTER — Encounter (INDEPENDENT_AMBULATORY_CARE_PROVIDER_SITE_OTHER): Payer: Self-pay | Admitting: Family Medicine

## 2019-09-04 ENCOUNTER — Ambulatory Visit (INDEPENDENT_AMBULATORY_CARE_PROVIDER_SITE_OTHER): Payer: Medicare Other | Admitting: Bariatrics

## 2019-09-04 ENCOUNTER — Other Ambulatory Visit: Payer: Self-pay

## 2019-09-04 VITALS — BP 137/77 | HR 83 | Temp 98.6°F | Ht 65.0 in | Wt 205.0 lb

## 2019-09-04 DIAGNOSIS — Z6834 Body mass index (BMI) 34.0-34.9, adult: Secondary | ICD-10-CM | POA: Diagnosis not present

## 2019-09-04 DIAGNOSIS — E669 Obesity, unspecified: Secondary | ICD-10-CM | POA: Diagnosis not present

## 2019-09-04 DIAGNOSIS — E559 Vitamin D deficiency, unspecified: Secondary | ICD-10-CM | POA: Diagnosis not present

## 2019-09-04 DIAGNOSIS — E8881 Metabolic syndrome: Secondary | ICD-10-CM

## 2019-09-04 DIAGNOSIS — E6609 Other obesity due to excess calories: Secondary | ICD-10-CM | POA: Diagnosis not present

## 2019-09-08 ENCOUNTER — Encounter (INDEPENDENT_AMBULATORY_CARE_PROVIDER_SITE_OTHER): Payer: Self-pay | Admitting: Bariatrics

## 2019-09-08 DIAGNOSIS — E6609 Other obesity due to excess calories: Secondary | ICD-10-CM | POA: Insufficient documentation

## 2019-09-08 DIAGNOSIS — Z6834 Body mass index (BMI) 34.0-34.9, adult: Secondary | ICD-10-CM | POA: Insufficient documentation

## 2019-09-08 NOTE — Progress Notes (Signed)
Chief Complaint:   OBESITY Erica Henson is here to discuss her progress with her obesity treatment plan along with follow-up of her obesity related diagnoses. Erica Henson is on the [employer] and states she is following her eating plan approximately 97% of the time. Erica Henson states she is exercising 0 minutes 0 times per week.  Today's visit was #: 56 Starting weight: 200 lbs Starting date: 09/03/2018 Today's weight: 205 lbs Today's date: 09/04/2019 Total lbs lost to date: 0 Total lbs lost since last in-office visit: 0  Interim History: Erica Henson is up 3 lbs. She is up about 3.5 lbs of water on the bioimpedance scale. She has not been able to walk sescondary to the MS.  Subjective:   Insulin resistance.  Erica Henson has a diagnosis of insulin resistance based on her elevated fasting insulin level >5. She continues to work on diet and exercise to decrease her risk of diabetes. She reports a decreased appetite.  Lab Results  Component Value Date   INSULIN 11.7 08/05/2019   INSULIN 21.7 12/05/2018   INSULIN 13.6 09/04/2018   Lab Results  Component Value Date   HGBA1C 5.0 08/05/2019   Vitamin D deficiency. Erica Henson reports limited sunlight exposure. Last Vitamin D level 35.3 on 08/05/2019.  Assessment/Plan:   Insulin resistance. Erica Henson will continue to work on weight loss, exercise, increasing healthy fats and protein, and decreasing simple carbohydrates to help decrease the risk of diabetes. She will consider more activity. Erica Henson agreed to follow-up with [place] as directed to closely monitor her progress.  Vitamin D deficiency. Low Vitamin D level contributes to fatigue and are associated with obesity, breast, and colon cancer. She agrees to continue to take Vitamin D and will follow-up for routine testing of Vitamin D, at least 2-3 times per year to avoid over-replacement.  Class 1 obesity with serious comorbidity and body mass index (BMI) of 34.0 to 34.9 in adult,  unspecified obesity type.  Erica Henson is currently in the action stage of change. As such, her goal is to continue with weight loss efforts. She has agreed to the Pescatarian Plan/Keto and intermittent fasting.  She will work on meal planning, intentional eating, no added salt in her diet, minimize eating out, and will drink a minimum of 64 ounces of water daily.   Exercise goals: Erica Henson will get on the elliptical.  Behavioral modification strategies: increasing lean protein intake, decreasing simple carbohydrates, increasing vegetables, increasing water intake, decreasing eating out, no skipping meals, meal planning and cooking strategies, keeping healthy foods in the home and planning for success.  Erica Henson has agreed to follow-up with our clinic in 2 weeks. She was informed of the importance of frequent follow-up visits to maximize her success with intensive lifestyle modifications for her multiple health conditions.   Objective:   Blood pressure 137/77, pulse 83, temperature 98.6 F (37 C), height 5\' 5"  (1.651 m), weight 205 lb (93 kg), SpO2 99 %. Body mass index is 34.11 kg/m.  General: Cooperative, alert, well developed, in no acute distress. HEENT: Conjunctivae and lids unremarkable. Cardiovascular: Regular rhythm.  Lungs: Normal work of breathing. Neurologic: No focal deficits.   Lab Results  Component Value Date   CREATININE 0.86 08/05/2019   BUN 11 08/05/2019   NA 140 08/05/2019   K 4.4 08/05/2019   CL 104 08/05/2019   CO2 22 08/05/2019   Lab Results  Component Value Date   ALT 12 08/05/2019   AST 16 08/05/2019   ALKPHOS 111 08/05/2019  BILITOT 0.5 08/05/2019   Lab Results  Component Value Date   HGBA1C 5.0 08/05/2019   HGBA1C 4.7 (L) 12/05/2018   HGBA1C 5.0 09/04/2018   Lab Results  Component Value Date   INSULIN 11.7 08/05/2019   INSULIN 21.7 12/05/2018   INSULIN 13.6 09/04/2018   Lab Results  Component Value Date   TSH 3.280 08/05/2019   Lab  Results  Component Value Date   CHOL 264 (H) 08/05/2019   HDL 78 08/05/2019   LDLCALC 172 (H) 08/05/2019   TRIG 82 08/05/2019   Lab Results  Component Value Date   WBC 4.4 08/05/2019   HGB 14.9 08/05/2019   HCT 43.1 08/05/2019   MCV 86 08/05/2019   PLT 278 08/05/2019   No results found for: IRON, TIBC, FERRITIN  Obesity Behavioral Intervention Documentation for Insurance:   Approximately 15 minutes were spent on the discussion below.  ASK: We discussed the diagnosis of obesity with Erica Henson today and Erica Henson agreed to give [place] permission to discuss obesity behavioral modification therapy today.  ASSESS: Erica Henson has the diagnosis of obesity and her BMI today is 34.2. Erica Henson is in the action stage of change.   ADVISE: Erica Henson was educated on the multiple health risks of obesity as well as the benefit of weight loss to improve her health. She was advised of the need for long term treatment and the importance of lifestyle modifications to improve her current health and to decrease her risk of future health problems.  AGREE: Multiple dietary modification options and treatment options were discussed and Erica Henson agreed to follow the recommendations documented in the above note.  ARRANGE: Erica Henson was educated on the importance of frequent visits to treat obesity as outlined per CMS and USPSTF guidelines and agreed to schedule her next follow up appointment today.  Attestation Statements:   Reviewed by clinician on day of visit: allergies, medications, problem list, medical history, surgical history, family history, social history, and previous encounter notes.  Time spent on visit including pre-visit chart review and post-visit care was 20 minutes.   Fernanda Drum, am acting as Energy manager for [employer], DO   I have reviewed the above documentation for accuracy and completeness, and I agree with the above. Corinna Capra, DO

## 2019-09-17 DIAGNOSIS — R5382 Chronic fatigue, unspecified: Secondary | ICD-10-CM | POA: Diagnosis not present

## 2019-09-17 DIAGNOSIS — G4700 Insomnia, unspecified: Secondary | ICD-10-CM | POA: Diagnosis not present

## 2019-09-17 DIAGNOSIS — G4719 Other hypersomnia: Secondary | ICD-10-CM | POA: Diagnosis not present

## 2019-09-17 DIAGNOSIS — G35 Multiple sclerosis: Secondary | ICD-10-CM | POA: Diagnosis not present

## 2019-09-18 DIAGNOSIS — A6000 Herpesviral infection of urogenital system, unspecified: Secondary | ICD-10-CM | POA: Diagnosis not present

## 2019-09-18 DIAGNOSIS — F331 Major depressive disorder, recurrent, moderate: Secondary | ICD-10-CM | POA: Diagnosis not present

## 2019-09-18 DIAGNOSIS — R5383 Other fatigue: Secondary | ICD-10-CM | POA: Diagnosis not present

## 2019-09-24 ENCOUNTER — Ambulatory Visit (INDEPENDENT_AMBULATORY_CARE_PROVIDER_SITE_OTHER): Payer: Medicare Other | Admitting: Family Medicine

## 2019-10-08 DIAGNOSIS — G4710 Hypersomnia, unspecified: Secondary | ICD-10-CM | POA: Diagnosis not present

## 2019-10-08 DIAGNOSIS — G4719 Other hypersomnia: Secondary | ICD-10-CM | POA: Diagnosis not present

## 2019-10-12 DIAGNOSIS — G4719 Other hypersomnia: Secondary | ICD-10-CM | POA: Insufficient documentation

## 2019-10-12 DIAGNOSIS — F32A Depression, unspecified: Secondary | ICD-10-CM | POA: Insufficient documentation

## 2019-10-12 HISTORY — DX: Other hypersomnia: G47.19

## 2019-12-03 DIAGNOSIS — Z789 Other specified health status: Secondary | ICD-10-CM | POA: Diagnosis not present

## 2019-12-03 DIAGNOSIS — E559 Vitamin D deficiency, unspecified: Secondary | ICD-10-CM | POA: Diagnosis not present

## 2019-12-03 DIAGNOSIS — Z0000 Encounter for general adult medical examination without abnormal findings: Secondary | ICD-10-CM | POA: Diagnosis not present

## 2019-12-03 DIAGNOSIS — R7989 Other specified abnormal findings of blood chemistry: Secondary | ICD-10-CM | POA: Diagnosis not present

## 2019-12-03 DIAGNOSIS — Z9079 Acquired absence of other genital organ(s): Secondary | ICD-10-CM

## 2019-12-03 HISTORY — DX: Acquired absence of other genital organ(s): Z90.79

## 2020-01-16 DIAGNOSIS — M545 Low back pain, unspecified: Secondary | ICD-10-CM | POA: Insufficient documentation

## 2020-01-16 DIAGNOSIS — K2100 Gastro-esophageal reflux disease with esophagitis, without bleeding: Secondary | ICD-10-CM

## 2020-01-16 DIAGNOSIS — M26609 Unspecified temporomandibular joint disorder, unspecified side: Secondary | ICD-10-CM | POA: Insufficient documentation

## 2020-01-16 HISTORY — DX: Low back pain, unspecified: M54.50

## 2020-01-16 HISTORY — DX: Unspecified temporomandibular joint disorder, unspecified side: M26.609

## 2020-01-16 HISTORY — DX: Gastro-esophageal reflux disease with esophagitis, without bleeding: K21.00

## 2020-01-29 DIAGNOSIS — M797 Fibromyalgia: Secondary | ICD-10-CM | POA: Insufficient documentation

## 2020-03-05 DIAGNOSIS — H6983 Other specified disorders of Eustachian tube, bilateral: Secondary | ICD-10-CM | POA: Diagnosis not present

## 2020-03-05 DIAGNOSIS — R300 Dysuria: Secondary | ICD-10-CM | POA: Diagnosis not present

## 2020-03-12 DIAGNOSIS — R112 Nausea with vomiting, unspecified: Secondary | ICD-10-CM | POA: Diagnosis not present

## 2020-03-12 DIAGNOSIS — R0602 Shortness of breath: Secondary | ICD-10-CM | POA: Diagnosis not present

## 2020-03-12 DIAGNOSIS — Z1152 Encounter for screening for COVID-19: Secondary | ICD-10-CM | POA: Diagnosis not present

## 2020-03-12 DIAGNOSIS — R05 Cough: Secondary | ICD-10-CM | POA: Diagnosis not present

## 2020-03-15 DIAGNOSIS — R05 Cough: Secondary | ICD-10-CM | POA: Diagnosis not present

## 2020-03-15 DIAGNOSIS — Z20822 Contact with and (suspected) exposure to covid-19: Secondary | ICD-10-CM | POA: Diagnosis not present

## 2020-03-25 DIAGNOSIS — G35 Multiple sclerosis: Secondary | ICD-10-CM | POA: Diagnosis not present

## 2020-03-25 DIAGNOSIS — Z79899 Other long term (current) drug therapy: Secondary | ICD-10-CM | POA: Diagnosis not present

## 2020-03-29 DIAGNOSIS — R1030 Lower abdominal pain, unspecified: Secondary | ICD-10-CM | POA: Diagnosis not present

## 2020-04-13 DIAGNOSIS — R200 Anesthesia of skin: Secondary | ICD-10-CM | POA: Diagnosis not present

## 2020-04-13 DIAGNOSIS — G250 Essential tremor: Secondary | ICD-10-CM | POA: Diagnosis not present

## 2020-04-13 DIAGNOSIS — G43909 Migraine, unspecified, not intractable, without status migrainosus: Secondary | ICD-10-CM | POA: Diagnosis not present

## 2020-04-13 DIAGNOSIS — G43709 Chronic migraine without aura, not intractable, without status migrainosus: Secondary | ICD-10-CM | POA: Diagnosis not present

## 2020-04-13 DIAGNOSIS — G9389 Other specified disorders of brain: Secondary | ICD-10-CM | POA: Diagnosis not present

## 2020-04-13 DIAGNOSIS — R5383 Other fatigue: Secondary | ICD-10-CM | POA: Diagnosis not present

## 2020-04-13 DIAGNOSIS — G379 Demyelinating disease of central nervous system, unspecified: Secondary | ICD-10-CM | POA: Diagnosis not present

## 2020-04-13 DIAGNOSIS — Z79899 Other long term (current) drug therapy: Secondary | ICD-10-CM | POA: Diagnosis not present

## 2020-05-14 DIAGNOSIS — G35 Multiple sclerosis: Secondary | ICD-10-CM | POA: Diagnosis not present

## 2020-06-04 DIAGNOSIS — M545 Low back pain, unspecified: Secondary | ICD-10-CM | POA: Diagnosis not present

## 2020-10-25 DIAGNOSIS — M7750 Other enthesopathy of unspecified foot: Secondary | ICD-10-CM | POA: Insufficient documentation

## 2020-10-25 HISTORY — DX: Other enthesopathy of unspecified foot and ankle: M77.50

## 2021-01-21 DIAGNOSIS — N644 Mastodynia: Secondary | ICD-10-CM | POA: Insufficient documentation

## 2021-01-21 HISTORY — DX: Mastodynia: N64.4

## 2021-06-30 DIAGNOSIS — Z8742 Personal history of other diseases of the female genital tract: Secondary | ICD-10-CM | POA: Insufficient documentation

## 2021-08-04 DIAGNOSIS — R102 Pelvic and perineal pain: Secondary | ICD-10-CM | POA: Insufficient documentation

## 2021-08-04 DIAGNOSIS — R7309 Other abnormal glucose: Secondary | ICD-10-CM | POA: Insufficient documentation

## 2021-08-19 DIAGNOSIS — H938X3 Other specified disorders of ear, bilateral: Secondary | ICD-10-CM | POA: Insufficient documentation

## 2021-09-06 DIAGNOSIS — H903 Sensorineural hearing loss, bilateral: Secondary | ICD-10-CM | POA: Insufficient documentation

## 2021-09-06 DIAGNOSIS — H93A1 Pulsatile tinnitus, right ear: Secondary | ICD-10-CM | POA: Insufficient documentation

## 2021-10-06 ENCOUNTER — Other Ambulatory Visit: Payer: Self-pay

## 2021-10-06 ENCOUNTER — Encounter: Payer: Self-pay | Admitting: Cardiology

## 2021-10-06 ENCOUNTER — Ambulatory Visit (INDEPENDENT_AMBULATORY_CARE_PROVIDER_SITE_OTHER): Payer: Medicare Other | Admitting: Cardiology

## 2021-10-06 VITALS — BP 130/74 | HR 85 | Ht 65.5 in | Wt 230.0 lb

## 2021-10-06 DIAGNOSIS — H93A1 Pulsatile tinnitus, right ear: Secondary | ICD-10-CM

## 2021-10-06 DIAGNOSIS — E6609 Other obesity due to excess calories: Secondary | ICD-10-CM

## 2021-10-06 DIAGNOSIS — R0602 Shortness of breath: Secondary | ICD-10-CM

## 2021-10-06 DIAGNOSIS — G35 Multiple sclerosis: Secondary | ICD-10-CM

## 2021-10-06 DIAGNOSIS — Z6834 Body mass index (BMI) 34.0-34.9, adult: Secondary | ICD-10-CM

## 2021-10-06 NOTE — Progress Notes (Unsigned)
Cardiology Consultation:    Date:  10/06/2021   ID:  Erica Henson, DOB 1975-08-07, MRN [id number]  PCP:  Maud Deed, PA  Cardiologist:  Gypsy Balsam, MD   Referring MD: Aquilla Hacker, PA-C   Chief Complaint  Patient presents with   carotid issues    Ongoing for months    Palpitations    History of Present Illness:    Erica Henson is a 46 y.o. female who is being seen today for the evaluation of tinnitus in the ear at the request of Aquilla Hacker, PA-C.  Lady with past medical history significant for multiple sclerosis diagnosed more than 10 years ago, apparently stable without any medications, she was referred to [place] because for last 7 years she experienced pulsative  tinnitus..  She did see ENT specialist with brought an idea that maybe it is related to carotid artery stenosis a heart murmur.  It is why she is in my office.  Overall she says she can walk climb stairs with no balance difficulties.  She described to have some palpitations happening maybe twice a week when she feel her heart skipping a beat.  But otherwise denies have any chest pain tightness squeezing pressure burning chest.  She never completely passed out.  She does have dyslipidemia but no recent cholesterol profile.  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 Patient encouraged to finish the Amoxicillin she was prescribed in the ER and to use her tessalon perles and inhaler as needed. Start antibiotics. Increase fluid intake. You may take OTC Zyrtec/Allegra/Claritin for additional symptom relief. Likewise, you may also take OTC Ibuprofen/Tylenol for additional relief   Acute vaginitis 10/01/2017   Last Assessment & Plan:  Formatting of this note might be different  from the original. A wet prep genital swab collected today to rule out trich, bacterial vaginosis, and yeast. Recommendations to be made accordingly pending results of labs.   Back pain    Breast pain 01/21/2021   Last Assessment & Plan:  Formatting of this note might be different from the original. breast exam is normal and patient feels symptoms are improving.  I offered a mammogram for reassurance, patient declines today.  I encouraged her to monitor for complete resolution and if any residual changes from baseline to let me know and we will order mammogram at that time.   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 given hx of GERD since teen yrs and hx of several esophagel dilitations/surgery for GERD. I will start on prevacid and zantac hs. pt advised regarding common gerd trigger foods/drinks. advised to avoid Nsaids. will refer to GI.   GERD (gastroesophageal reflux disease)    HNP (herniated nucleus pulposus), lumbar 06/07/2016   Hyperlipidemia    Hypertension    Insomnia 04/02/2018   Joint pain    Low back pain 01/16/2020   Low vitamin D level 04/02/2018   Multiple sclerosis (HCC)    Numbness 04/02/2018   Osteopenia    Other enthesopathy of ankle and  tarsus 10/25/2020   Formatting of this note might be different from the original. Added automatically from request for surgery [id number] Formatting of this note might be different from the original. Added automatically from request for surgery [id number]   Other problems related to lifestyle 02/03/2018   RUQ pain 02/03/2018   Formatting of this note might be different from the original. Last Assessment & Plan:  Order placed for gallbladder and liver ultrasound to further evaluate symptoms. She was advised to report to the ER  or call 911 if symptoms get worse. Last Assessment & Plan:  Formatting of this note might be different from the original. Order placed for gallbladder and liver ultrasound to further evaluate sympt   Screening examination for STD (sexually transmitted disease) 10/01/2017   Last Assessment & Plan:  Formatting of this note might be different from the original. Full STD panel testing completed today per patient request. Pending results additional recommendations to be made accordingly. Safe sex practices reviewed with patient. She verbalized understanding.   SOB (shortness of breath) 08/24/2017   Last Assessment & Plan:  Formatting of this note might be different from the original. Albuterol nebulizer treatment administered to patient. Patient reported improved breathing and tolerated procedure well. Patient given rx for Albuterol Inhaler to take as needed. She was advised to call 911 or report to the ER should her symptoms get worse or should she have chest pain/sob.   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 Will refer pt to [facility] surgeon  worsening despite corrective surgery last year and failed measures leadind up to surgery to include 2 kinds of splints and various pain meds/muscle relaxers.   Urine frequency 02/03/2018   Formatting of this note might be different from the original. Last Assessment & Plan:  UA negative for UTI.  Last Assessment & Plan:  Formatting of this note might be different from the original.  Frequent urination, dysuria.  Urinalyses and previous testing unremarkable for infectious cause.  Will send urine culture today.  Differential includes interstitial cystitis, urge incontinence, multiple    Vaginal odor 02/03/2018   Last Assessment & Plan:  Formatting of this note might be different from the original. Vaginal swab collected to further evaluate for trich, yeast, and BV. Urine to be tested for  Gonorrhea/chlamydia. All remaining STD testing to be completed by blood samples. Recommendations to be made pending labs. Safe sex practices reviewed with patient and she was advised to urinate following intercourse.   Vitamin D deficiency    White matter abnormality on MRI of brain 04/02/2018    Past Surgical History:  Procedure Laterality Date   ABDOMINAL HYSTERECTOMY     ABDOMINOPLASTY     BREAST REDUCTION SURGERY     CHOLECYSTECTOMY     TMJ ARTHROPLASTY      Current Medications: Current Meds  Medication Sig   b complex vitamins tablet Take 1 tablet by mouth daily.   Magnesium Gluconate (MAGNESIUM 27 PO) Take 1 tablet by mouth 3 (three) times a week.   polyethylene glycol powder (GLYCOLAX/MIRALAX) 17 GM/SCOOP powder Take 17 g by mouth 2 (two) times daily as needed. (Patient taking differently: Take 17 g by mouth 2 (two) times daily as needed for mild constipation or moderate constipation.)   Probiotic Product (PROBIOTIC-10 PO) Take 1 tablet by mouth daily.   vitamin C (ASCORBIC ACID) 250 MG tablet Take 250 mg by mouth daily.  Allergies:   Codeine, Gadobutrol, and Lunesta [eszopiclone]   Social History   Socioeconomic History   Marital status: Divorced    Spouse name: Not on file   Number of children: 4   Years of education: Not on file   Highest education level: Not on file  Occupational History   Occupation: disabled  Tobacco Use   Smoking status: Never   Smokeless tobacco: Never  Vaping Use   Vaping Use: Never used  Substance and Sexual Activity   Alcohol use: Yes    Comment: social   Drug use: Never   Sexual activity: Not on file  Other Topics Concern   Not on file  Social History Narrative   Not on file   Social Determinants of Health   Financial Resource Strain: Not on file  Food Insecurity: Not on file  Transportation Needs: Not on file  Physical Activity: Not on file  Stress: Not on file  Social Connections: Not on file     Family History: The  patient's family history includes Diabetes in her mother; Hypertension in her father and mother; Stroke in her father. ROS:   Please see the history of present illness.    All 14 point review of systems negative except as described per history of present illness.  EKGs/Labs/Other Studies Reviewed:    The following studies were reviewed today:   EKG:  EKG is  ordered today.  The ekg ordered today demonstrates normal sinus rhythm, normal P interval, normal QS complex duration morphology, no ST segment changes.  Recent Labs: No results found for requested labs within last 8760 hours.  Recent Lipid Panel    Component Value Date/Time   CHOL 264 (H) 08/05/2019 1728   TRIG 82 08/05/2019 1728   HDL 78 08/05/2019 1728   LDLCALC 172 (H) 08/05/2019 1728    Physical Exam:    VS:  BP 130/74 (BP Location: Right Arm, Patient Position: Sitting)    Pulse 85    Ht 5' 5.5" (1.664 m)    Wt 230 lb (104.3 kg)    SpO2 94%    BMI 37.69 kg/m     Wt Readings from Last 3 Encounters:  10/06/21 230 lb (104.3 kg)  09/04/19 205 lb (93 kg)  08/20/19 202 lb (91.6 kg)     GEN:  Well nourished, well developed in no acute distress HEENT: Normal NECK: No JVD; No carotid bruits LYMPHATICS: No lymphadenopathy CARDIAC: RRR, no murmurs, no rubs, no gallops RESPIRATORY:  Clear to auscultation without rales, wheezing or rhonchi  ABDOMEN: Soft, non-tender, non-distended MUSCULOSKELETAL:  No edema; No deformity  SKIN: Warm and dry NEUROLOGIC:  Alert and oriented x 3 PSYCHIATRIC:  Normal affect   ASSESSMENT:    1. Pulsatile tinnitus of right ear   2. Multiple sclerosis (HCC)   3. Class 1 obesity due to excess calories without serious comorbidity with body mass index (BMI) of 34.0 to 34.9 in adult    PLAN:    In order of problems listed above:  Pulsatile tinnitus on the right ear.  We will do carotic ultrasounds to make sure she does not have any significant cardiac artery stenosis, we will also schedule  her to have echocardiogram because of dyspnea on exertion on auscultation I do not hear any murmur. Multiple sclerosis, stable.  Continue present medications. Dyspnea on exertion, echocardiogram will be done to assess left ventricle ejection fraction. Palpitations: When I see her we will put monitor on her   Medication Adjustments/Labs  and Tests Ordered: Current medicines are reviewed at length with the patient today.  Concerns regarding medicines are outlined above.  No orders of the defined types were placed in this encounter.  No orders of the defined types were placed in this encounter.   Signed, Georgeanna Lea, MD, [facility]. 10/06/2021 4:24 PM     [facility]

## 2021-10-06 NOTE — Patient Instructions (Signed)
Medication Instructions:  ?Your physician recommends that you continue on your current medications as directed. Please refer to the Current Medication list given to you today. ? ?*If you need a refill on your cardiac medications before your next appointment, please call your pharmacy* ? ? ?Lab Work: ?None ?If you have labs (blood work) drawn today and your tests are completely normal, you will receive your results only by: ?MyChart Message (if you have MyChart) OR ?A paper copy in the mail ?If you have any lab test that is abnormal or we need to change your treatment, we will call you to review the results. ? ? ?Testing/Procedures: ?Your physician has requested that you have an echocardiogram. Echocardiography is a painless test that uses sound waves to create images of your heart. It provides your doctor with information about the size and shape of your heart and how well your heart?s chambers and valves are working. This procedure takes approximately one hour. There are no restrictions for this procedure. ? ?Your physician has requested that you have a carotid duplex. This test is an ultrasound of the carotid arteries in your neck. It looks at blood flow through these arteries that supply the brain with blood. Allow one hour for this exam. There are no restrictions or special instructions. ? ? ? ?Follow-Up: ?At [facility], you and your health needs are our priority.  As part of our continuing mission to provide you with exceptional heart care, we have created designated Provider Care Teams.  These Care Teams include your primary Cardiologist (physician) and Advanced Practice Providers (APPs -  Physician Assistants and Nurse Practitioners) who all work together to provide you with the care you need, when you need it. ? ?We recommend signing up for the patient portal called "MyChart".  Sign up information is provided on this After Visit Summary.  MyChart is used to connect with patients for Virtual Visits  (Telemedicine).  Patients are able to view lab/test results, encounter notes, upcoming appointments, etc.  Non-urgent messages can be sent to your provider as well.   ?To learn more about what you can do with MyChart, go to [email or web address].   ? ?Your next appointment:   ?6 month(s) ? ?The format for your next appointment:   ?In Person ? ?Provider:   ?Robert Krasowski, MD  ? ? ?Other Instructions ?None ? ?

## 2021-10-12 ENCOUNTER — Other Ambulatory Visit: Payer: Self-pay | Admitting: Cardiology

## 2021-10-12 DIAGNOSIS — H93A1 Pulsatile tinnitus, right ear: Secondary | ICD-10-CM

## 2021-10-12 DIAGNOSIS — R0989 Other specified symptoms and signs involving the circulatory and respiratory systems: Secondary | ICD-10-CM

## 2021-10-12 DIAGNOSIS — E6609 Other obesity due to excess calories: Secondary | ICD-10-CM

## 2021-10-13 ENCOUNTER — Other Ambulatory Visit: Payer: Self-pay

## 2021-10-13 ENCOUNTER — Ambulatory Visit (HOSPITAL_BASED_OUTPATIENT_CLINIC_OR_DEPARTMENT_OTHER)
Admission: RE | Admit: 2021-10-13 | Discharge: 2021-10-13 | Disposition: A | Discharge status: Home / Self Care | Payer: Medicare Other | Source: Ambulatory Visit | Attending: Cardiology | Admitting: Cardiology

## 2021-10-13 DIAGNOSIS — H93A1 Pulsatile tinnitus, right ear: Secondary | ICD-10-CM

## 2021-10-13 DIAGNOSIS — E6609 Other obesity due to excess calories: Secondary | ICD-10-CM | POA: Diagnosis present

## 2021-10-13 DIAGNOSIS — R0989 Other specified symptoms and signs involving the circulatory and respiratory systems: Secondary | ICD-10-CM | POA: Diagnosis present

## 2021-10-13 DIAGNOSIS — R0602 Shortness of breath: Secondary | ICD-10-CM

## 2021-10-13 DIAGNOSIS — G35 Multiple sclerosis: Secondary | ICD-10-CM | POA: Insufficient documentation

## 2021-10-13 DIAGNOSIS — R0609 Other forms of dyspnea: Secondary | ICD-10-CM

## 2021-10-13 DIAGNOSIS — Z6834 Body mass index (BMI) 34.0-34.9, adult: Secondary | ICD-10-CM | POA: Insufficient documentation

## 2021-10-13 DIAGNOSIS — G35D Multiple sclerosis, unspecified: Secondary | ICD-10-CM

## 2021-10-13 LAB — ECHOCARDIOGRAM COMPLETE
AV Mean grad: 3 mmHg
AV Peak grad: 5.1 mmHg
Ao pk vel: 1.13 m/s
Area-P 1/2: 4.68 cm2
S' Lateral: 3.1 cm

## 2021-10-13 NOTE — Progress Notes (Signed)
?  Echocardiogram ?2D Echocardiogram has been performed. ? ?Erica Henson ?10/13/2021, 1:55 PM ?

## 2021-10-14 ENCOUNTER — Ambulatory Visit (HOSPITAL_BASED_OUTPATIENT_CLINIC_OR_DEPARTMENT_OTHER)
Admission: RE | Admit: 2021-10-14 | Discharge: 2021-10-14 | Disposition: A | Discharge status: Home / Self Care | Payer: Medicare Other | Source: Ambulatory Visit | Attending: Cardiology | Admitting: Cardiology

## 2021-10-14 DIAGNOSIS — E6609 Other obesity due to excess calories: Secondary | ICD-10-CM | POA: Diagnosis not present

## 2021-10-14 DIAGNOSIS — Z6834 Body mass index (BMI) 34.0-34.9, adult: Secondary | ICD-10-CM

## 2021-10-14 DIAGNOSIS — H93A1 Pulsatile tinnitus, right ear: Secondary | ICD-10-CM | POA: Diagnosis not present

## 2021-10-14 DIAGNOSIS — R0989 Other specified symptoms and signs involving the circulatory and respiratory systems: Secondary | ICD-10-CM | POA: Diagnosis not present

## 2021-12-19 ENCOUNTER — Other Ambulatory Visit: Payer: Self-pay

## 2021-12-19 ENCOUNTER — Encounter (HOSPITAL_BASED_OUTPATIENT_CLINIC_OR_DEPARTMENT_OTHER): Payer: Self-pay | Admitting: Emergency Medicine

## 2021-12-19 DIAGNOSIS — M79604 Pain in right leg: Secondary | ICD-10-CM | POA: Insufficient documentation

## 2021-12-19 DIAGNOSIS — M25561 Pain in right knee: Secondary | ICD-10-CM | POA: Insufficient documentation

## 2021-12-19 DIAGNOSIS — M25461 Effusion, right knee: Secondary | ICD-10-CM | POA: Diagnosis present

## 2021-12-19 DIAGNOSIS — I10 Essential (primary) hypertension: Secondary | ICD-10-CM | POA: Insufficient documentation

## 2021-12-19 NOTE — ED Triage Notes (Signed)
Patient reports 2 months ago she began having right knee pain, given meloxicam without relief. Patient reports she has been going to physical therapy. Reports sudden onset of swelling behind her knee today.

## 2021-12-20 ENCOUNTER — Ambulatory Visit (HOSPITAL_BASED_OUTPATIENT_CLINIC_OR_DEPARTMENT_OTHER)
Admission: RE | Admit: 2021-12-20 | Discharge: 2021-12-20 | Disposition: A | Discharge status: Home / Self Care | Payer: Medicare Other | Source: Ambulatory Visit | Attending: Emergency Medicine | Admitting: Emergency Medicine

## 2021-12-20 ENCOUNTER — Emergency Department (HOSPITAL_BASED_OUTPATIENT_CLINIC_OR_DEPARTMENT_OTHER)
Admission: EM | Admit: 2021-12-20 | Discharge: 2021-12-20 | Disposition: A | Discharge status: Home / Self Care | Payer: Medicare Other | Attending: Emergency Medicine | Admitting: Emergency Medicine

## 2021-12-20 DIAGNOSIS — M25461 Effusion, right knee: Secondary | ICD-10-CM | POA: Diagnosis not present

## 2021-12-20 DIAGNOSIS — M79604 Pain in right leg: Secondary | ICD-10-CM | POA: Insufficient documentation

## 2021-12-20 MED ORDER — HYDROCODONE-ACETAMINOPHEN 5-325 MG PO TABS
1.0000 | ORAL_TABLET | Freq: Once | ORAL | Status: AC
  Administered 2021-12-20: 1 via ORAL
  Filled 2021-12-20: qty 1

## 2021-12-20 MED ORDER — RIVAROXABAN 15 MG PO TABS
15.0000 mg | ORAL_TABLET | Freq: Once | ORAL | Status: AC
  Administered 2021-12-20: 15 mg via ORAL
  Filled 2021-12-20: qty 1

## 2021-12-20 NOTE — ED Notes (Signed)
Pt is requesting pain medication-EDP notified

## 2021-12-20 NOTE — ED Provider Notes (Signed)
[facility] EMERGENCY DEPARTMENT Provider Note   CSN: [id number] Arrival date & time: 12/19/21  2121     History  No chief complaint on file.   Erica Henson is a 46 y.o. female.  The history is provided by the patient.  She has history of hypertension, hyperlipidemia, multiple sclerosis and comes in because of pain and swelling in her right knee.  She has been having pain in her right knee for the last 2 months and saw an orthopedic doctor who sent her purse for physical therapy.  She had noted a knot in the popliteal area and her therapist thought it might have been a Baker's cyst.  However, it has gotten bigger and it seems to be impairing her ability to flex her knee.  She denies any chest pain or shortness of breath.   Home Medications Prior to Admission medications   Medication Sig Start Date End Date Taking? Authorizing Provider  b complex vitamins tablet Take 1 tablet by mouth daily.    [provider]  Magnesium Gluconate (MAGNESIUM 27 PO) Take 1 tablet by mouth 3 (three) times a week.    [provider]  polyethylene glycol powder (GLYCOLAX/MIRALAX) 17 GM/SCOOP powder Take 17 g by mouth 2 (two) times daily as needed. Patient taking differently: Take 17 g by mouth 2 (two) times daily as needed for mild constipation or moderate constipation. 08/06/19   Helane Rima, DO  Probiotic Product (PROBIOTIC-10 PO) Take 1 tablet by mouth daily.    [provider]  vitamin C (ASCORBIC ACID) 250 MG tablet Take 250 mg by mouth daily.    [provider]      Allergies    Codeine, Gadobutrol, and Lunesta [eszopiclone]    Review of Systems   Review of Systems  All other systems reviewed and are negative.  Physical Exam Updated Vital Signs BP (!) 117/91 (BP Location: Right Arm)   Pulse 99   Temp 98.4 F (36.9 C) (Oral)   Resp 18   Ht 5\' 5"  (1.651 m)   Wt 103.4 kg   SpO2 99%   BMI 37.94 kg/m  Physical Exam Vitals and nursing note  reviewed.  46 year old female, resting comfortably and in no acute distress. Vital signs are significant for borderline elevated diastolic blood pressure. Oxygen saturation is 99%, which is normal. Head is normocephalic and atraumatic. PERRLA, EOMI. Oropharynx is clear. Neck is nontender and supple without adenopathy or JVD. Back is nontender and there is no CVA tenderness. Lungs are clear without rales, wheezes, or rhonchi. Chest is nontender. Heart has regular rate and rhythm without murmur. Abdomen is soft, flat, nontender. Extremities: Right knee appears slightly swollen compared with the left, but there is no effusion present on exam.  There is pain on range of motion but there is no instability on valgus or varus stress and Lachman McMurray's tests are negative.  I cannot palpate any definite swelling in the popliteal area but there is tenderness to palpation in that area.  Left right calf circumference is 1 cm greater than left calf circumference and there is some tenderness to palpation in the proximal part of the gastrocnemius soleus complex.  No cords are palpable. Skin is warm and dry without rash. Neurologic: Mental status is normal, cranial nerves are intact.  ED Results / Procedures / Treatments    Procedures Procedures    Medications Ordered in ED Medications  Rivaroxaban (XARELTO) tablet 15 mg (has no administration in time range)  ED Course/ Medical Decision Making/ A&P                           Medical Decision Making  Right knee and Proximal calf pain which probably is a Baker's cyst.  However, with calf swelling, need to consider possibility of DVT.  She has had increased mobility because of knee pain for the last several months.  She will be brought back for venous ultrasound which should also be able to identify presence of a Baker's cyst.  She is given a dose of rivaroxaban.  She is also unhappy with her orthopedic physician's evaluation, she is referred to  sports medicine for a second opinion.  Final Clinical Impression(s) / ED Diagnoses Final diagnoses:  Pain and swelling of right knee    Rx / DC Orders ED Discharge Orders          Ordered    VAS [place] LOWER EXTREMITY VENOUS (DVT)        12/20/21 0042              Dione Booze, MD 12/20/21 [phone]

## 2021-12-20 NOTE — Discharge Instructions (Signed)
Apply ice for thirty minutes at a time, four times a day.  Return in the morning for an ultrasound to make sure it is not a blood clot.

## 2022-03-08 ENCOUNTER — Encounter (INDEPENDENT_AMBULATORY_CARE_PROVIDER_SITE_OTHER): Payer: Self-pay

## 2022-03-16 DIAGNOSIS — K829 Disease of gallbladder, unspecified: Secondary | ICD-10-CM | POA: Insufficient documentation

## 2022-03-16 DIAGNOSIS — M8580 Other specified disorders of bone density and structure, unspecified site: Secondary | ICD-10-CM | POA: Insufficient documentation

## 2022-03-16 DIAGNOSIS — E559 Vitamin D deficiency, unspecified: Secondary | ICD-10-CM | POA: Insufficient documentation

## 2022-03-16 DIAGNOSIS — M2550 Pain in unspecified joint: Secondary | ICD-10-CM | POA: Insufficient documentation

## 2022-03-16 DIAGNOSIS — K219 Gastro-esophageal reflux disease without esophagitis: Secondary | ICD-10-CM | POA: Insufficient documentation

## 2022-03-16 DIAGNOSIS — I10 Essential (primary) hypertension: Secondary | ICD-10-CM | POA: Insufficient documentation

## 2022-03-16 DIAGNOSIS — K5900 Constipation, unspecified: Secondary | ICD-10-CM | POA: Insufficient documentation

## 2022-03-16 DIAGNOSIS — E785 Hyperlipidemia, unspecified: Secondary | ICD-10-CM | POA: Insufficient documentation

## 2022-03-17 ENCOUNTER — Ambulatory Visit: Payer: Medicare Other | Admitting: Cardiology

## 2022-12-12 IMAGING — US US EXTREM LOW VENOUS*R*
1 series · 13 of 24 positions shown · non-contrast
Comparison: None Available.

CLINICAL DATA: Right lower extremity pain.  Evaluate for DVT.



[Series 1: us extrem low venous*right* · 13 of 31 slices shown]
[im 1/31]
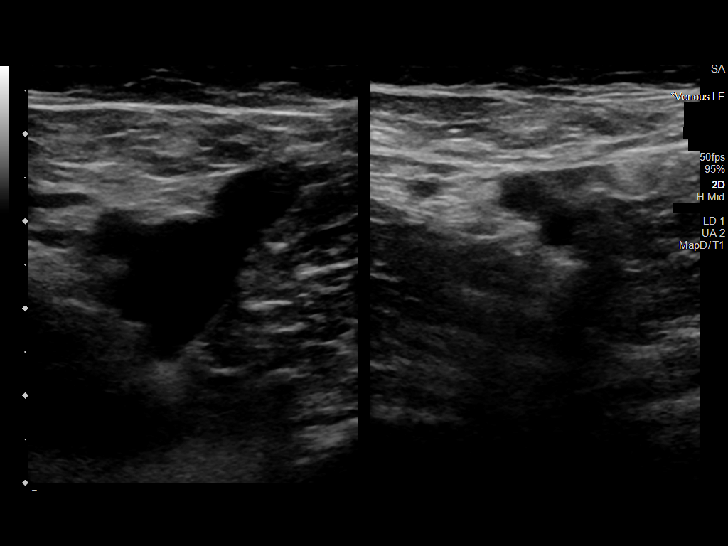
[im 3/31]
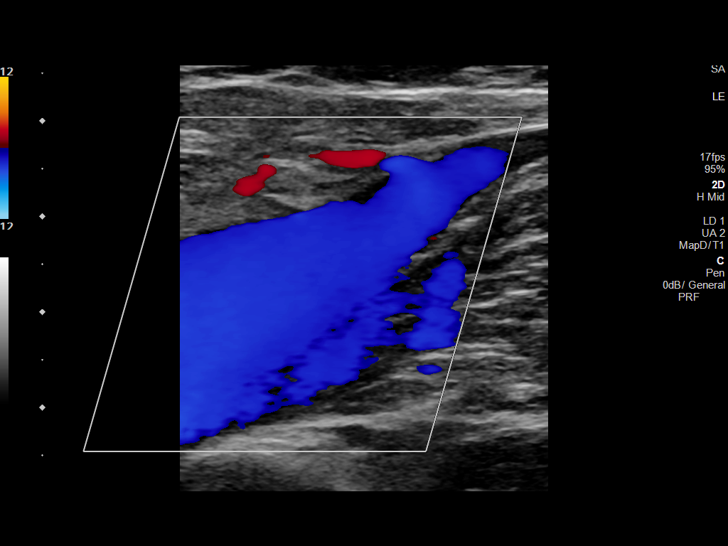
[im 6/31]
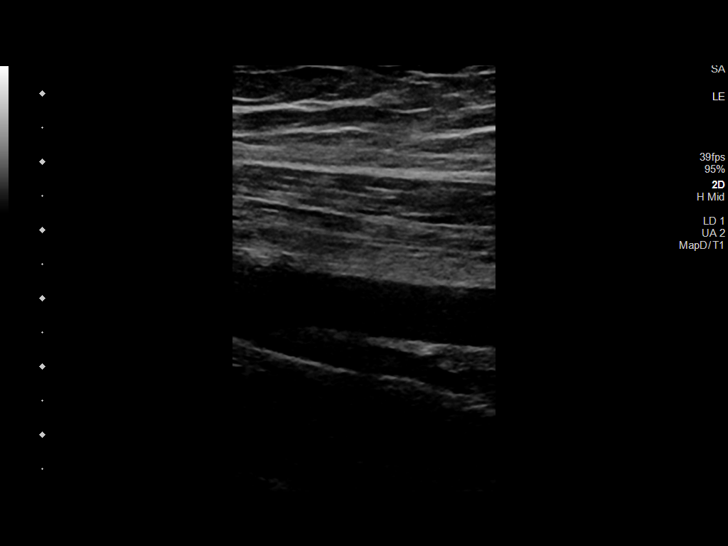
[im 8/31]
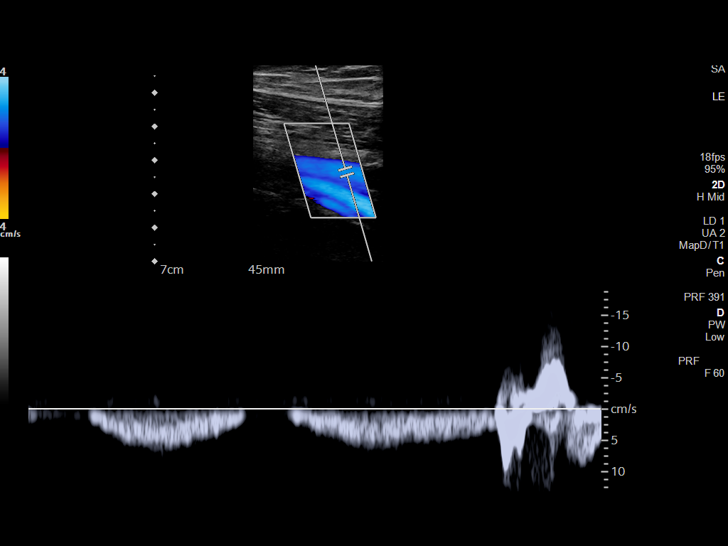
[im 11/31]
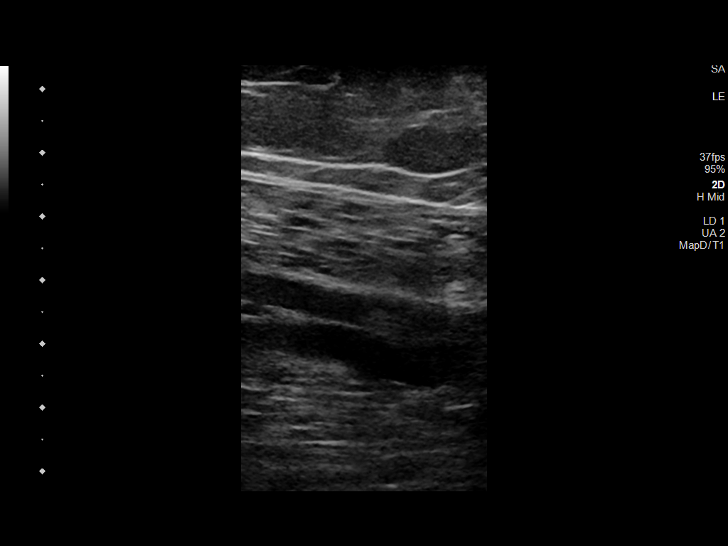
[im 14/31]
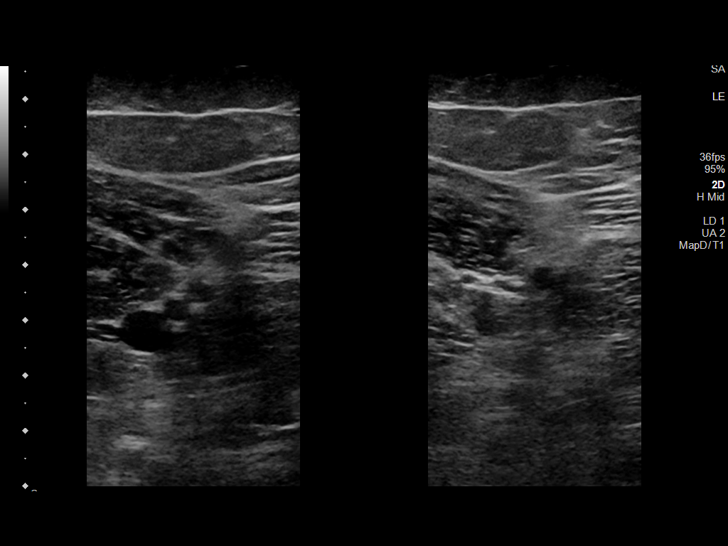
[im 16/31]
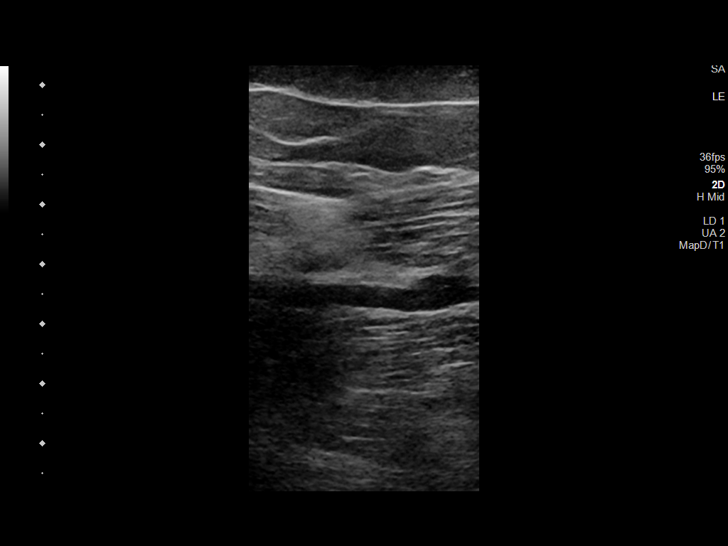
[im 17/31]
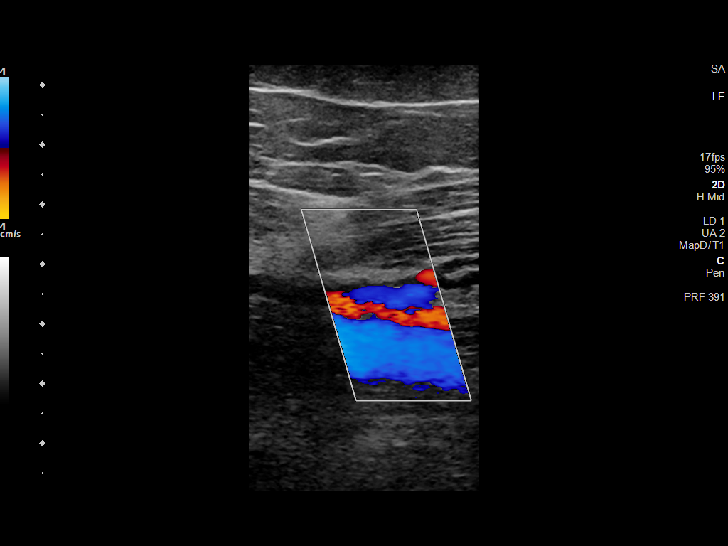
[im 20/31]
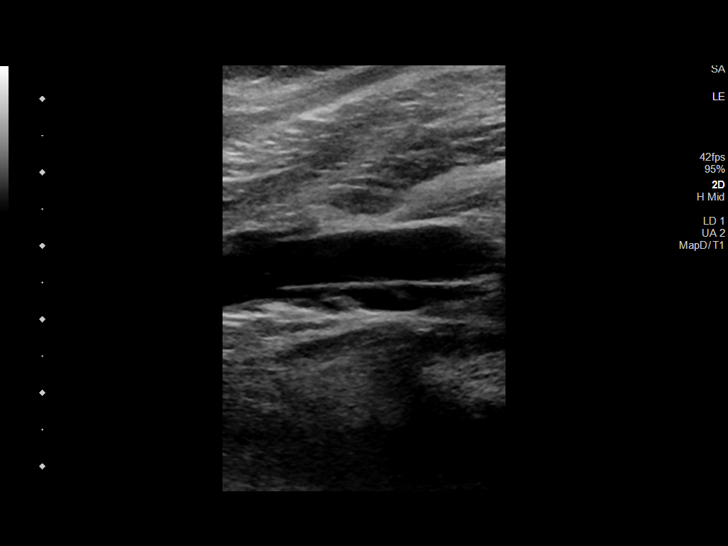
[im 23/31]
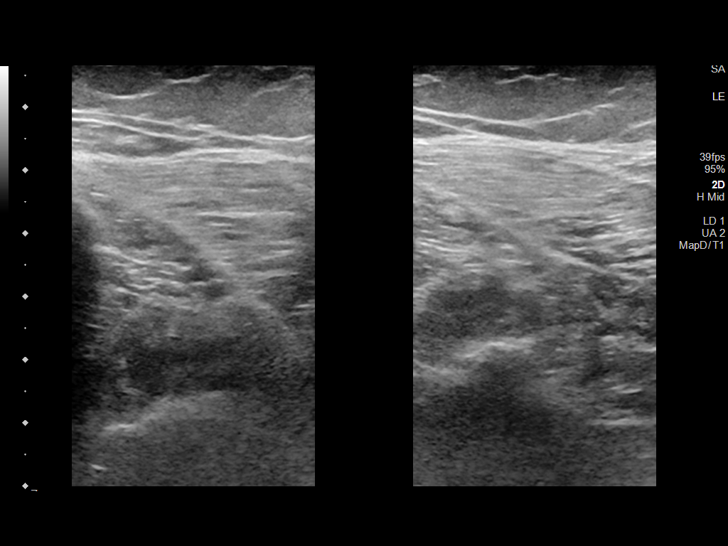
[im 25/31]
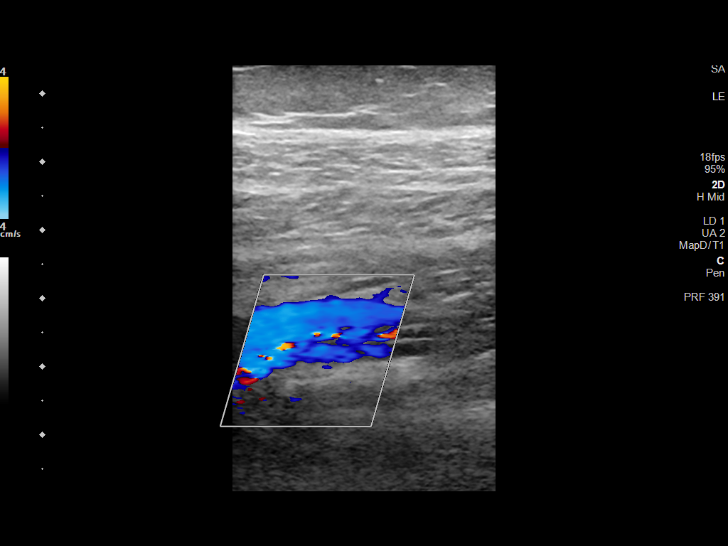
[im 28/31]
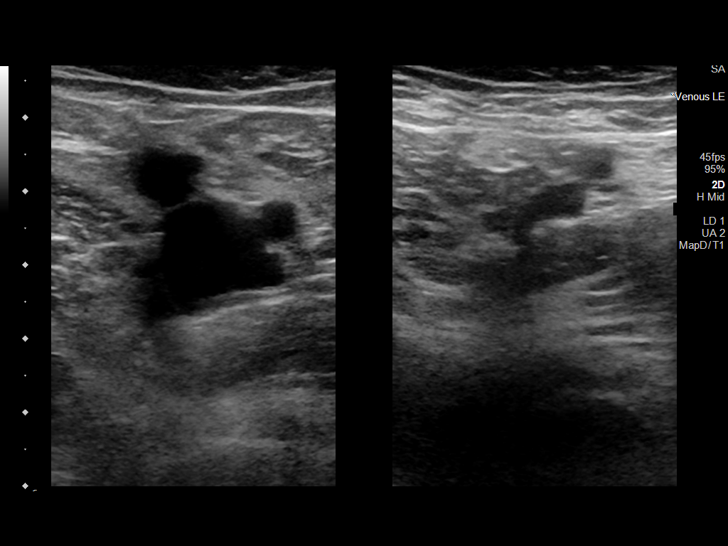
[im 31/31]
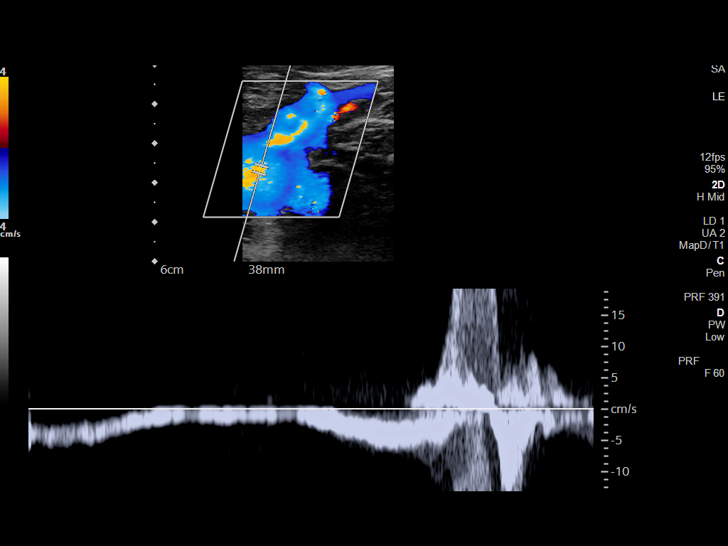

[13 of 24 positions shown; findings below may reference images not displayed]

FINDINGS: Contralateral Common Femoral Vein: Respiratory phasicity is normal
and symmetric with the symptomatic side. No evidence of thrombus.
Normal compressibility.

Common Femoral Vein: No evidence of thrombus. Normal
compressibility, respiratory phasicity and response to augmentation.

Saphenofemoral Junction: No evidence of thrombus. Normal
compressibility and flow on color Doppler imaging.

Profunda Femoral Vein: No evidence of thrombus. Normal
compressibility and flow on color Doppler imaging.

Femoral Vein: No evidence of thrombus. Normal compressibility,
respiratory phasicity and response to augmentation.

Popliteal Vein: No evidence of thrombus. Normal compressibility,
respiratory phasicity and response to augmentation.

Calf Veins: No evidence of thrombus. Normal compressibility and flow
on color Doppler imaging.

Superficial Great Saphenous Vein: No evidence of thrombus. Normal
compressibility.

Venous Reflux:  None.

Other Findings:  None.
IMPRESSION: No evidence of DVT within the right lower extremity.
